# Patient Record
Sex: Female | Born: 1965 | Race: Black or African American | Hispanic: No | Marital: Married | Smoking: Former smoker
Health system: Southern US, Community
[De-identification: ages and names within clinical notes are randomized; demographics above are authoritative.]

## PROBLEM LIST (undated history)

## (undated) DIAGNOSIS — M1990 Unspecified osteoarthritis, unspecified site: Secondary | ICD-10-CM

## (undated) DIAGNOSIS — M549 Dorsalgia, unspecified: Secondary | ICD-10-CM

## (undated) DIAGNOSIS — M797 Fibromyalgia: Secondary | ICD-10-CM

## (undated) DIAGNOSIS — F410 Panic disorder [episodic paroxysmal anxiety] without agoraphobia: Secondary | ICD-10-CM

## (undated) DIAGNOSIS — G894 Chronic pain syndrome: Secondary | ICD-10-CM

## (undated) DIAGNOSIS — R51 Headache: Secondary | ICD-10-CM

## (undated) HISTORY — DX: Headache: R51

## (undated) HISTORY — DX: Panic disorder (episodic paroxysmal anxiety): F41.0

## (undated) HISTORY — PX: OTHER SURGICAL HISTORY: SHX169

## (undated) HISTORY — DX: Chronic pain syndrome: G89.4

## (undated) HISTORY — PX: CARPAL TUNNEL RELEASE: SHX101

## (undated) HISTORY — DX: Unspecified osteoarthritis, unspecified site: M19.90

---

## 2008-12-08 ENCOUNTER — Ambulatory Visit: Payer: Self-pay | Admitting: Diagnostic Radiology

## 2008-12-08 ENCOUNTER — Emergency Department (HOSPITAL_BASED_OUTPATIENT_CLINIC_OR_DEPARTMENT_OTHER): Admission: EM | Admit: 2008-12-08 | Discharge: 2008-12-08 | Payer: Self-pay | Admitting: Emergency Medicine

## 2010-10-05 ENCOUNTER — Emergency Department (HOSPITAL_BASED_OUTPATIENT_CLINIC_OR_DEPARTMENT_OTHER)
Admission: EM | Admit: 2010-10-05 | Discharge: 2010-10-05 | Disposition: A | Discharge status: Home / Self Care | Payer: No Typology Code available for payment source | Attending: Emergency Medicine | Admitting: Emergency Medicine

## 2010-10-05 ENCOUNTER — Emergency Department (INDEPENDENT_AMBULATORY_CARE_PROVIDER_SITE_OTHER): Payer: No Typology Code available for payment source

## 2010-10-05 DIAGNOSIS — M542 Cervicalgia: Secondary | ICD-10-CM

## 2010-10-05 DIAGNOSIS — Y92410 Unspecified street and highway as the place of occurrence of the external cause: Secondary | ICD-10-CM | POA: Insufficient documentation

## 2010-10-05 DIAGNOSIS — S01501A Unspecified open wound of lip, initial encounter: Secondary | ICD-10-CM | POA: Insufficient documentation

## 2010-10-05 DIAGNOSIS — M546 Pain in thoracic spine: Secondary | ICD-10-CM

## 2010-10-05 DIAGNOSIS — M549 Dorsalgia, unspecified: Secondary | ICD-10-CM | POA: Insufficient documentation

## 2010-10-05 DIAGNOSIS — M545 Low back pain: Secondary | ICD-10-CM

## 2011-02-02 ENCOUNTER — Encounter: Payer: Self-pay | Admitting: *Deleted

## 2011-02-02 ENCOUNTER — Emergency Department (INDEPENDENT_AMBULATORY_CARE_PROVIDER_SITE_OTHER): Payer: Medicare Other

## 2011-02-02 ENCOUNTER — Emergency Department (HOSPITAL_BASED_OUTPATIENT_CLINIC_OR_DEPARTMENT_OTHER)
Admission: EM | Admit: 2011-02-02 | Discharge: 2011-02-02 | Disposition: A | Discharge status: Home / Self Care | Payer: Medicare Other | Attending: Emergency Medicine | Admitting: Emergency Medicine

## 2011-02-02 DIAGNOSIS — M6281 Muscle weakness (generalized): Secondary | ICD-10-CM

## 2011-02-02 DIAGNOSIS — F17200 Nicotine dependence, unspecified, uncomplicated: Secondary | ICD-10-CM | POA: Insufficient documentation

## 2011-02-02 DIAGNOSIS — M549 Dorsalgia, unspecified: Secondary | ICD-10-CM

## 2011-02-02 DIAGNOSIS — M545 Low back pain, unspecified: Secondary | ICD-10-CM | POA: Insufficient documentation

## 2011-02-02 HISTORY — DX: Fibromyalgia: M79.7

## 2011-02-02 HISTORY — DX: Dorsalgia, unspecified: M54.9

## 2011-02-02 HISTORY — DX: Unspecified osteoarthritis, unspecified site: M19.90

## 2011-02-02 MED ORDER — CYCLOBENZAPRINE HCL 5 MG PO TABS: 5.0000 mg | ORAL_TABLET | Freq: Three times a day (TID) | ORAL | Status: AC | PRN

## 2011-02-02 MED ORDER — HYDROCODONE-ACETAMINOPHEN 5-325 MG PO TABS: 2.0000 | ORAL_TABLET | Freq: Once | ORAL | Status: DC

## 2011-02-02 MED ORDER — HYDROCODONE-ACETAMINOPHEN 5-325 MG PO TABS
2.0000 | ORAL_TABLET | Freq: Four times a day (QID) | ORAL | Status: AC | PRN
Start: 2011-02-02 — End: 2011-02-12
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
## 2011-02-02 NOTE — ED Provider Notes (Signed)
History     CSN: [id number] Arrival date & time: 02/02/2011 10:13 AM   First MD Initiated Contact with Patient 02/02/11 1023      Chief Complaint  Patient presents with  . Back Pain    (Consider location/radiation/quality/duration/timing/severity/associated sxs/prior treatment) HPI The patient has a history of fibromyalgia as well as chronic back pain. She presents today as she had a fall at home and she reports that she had sudden weakness in her right leg. On further thought she does note that she's had weakness in her leg previously. Patient is a poor historian. She denies urinary or bowel incontinence. She denies any saddle anesthesia. She does endorse low back pain radiates down the posterior aspect of the right leg. This made worse with movement better with rest. There are no other associated or modifying factors. Past Medical History  Diagnosis Date  . Back pain   . Osteoarthritis   . Migraine   . Fibromyalgia     Past Surgical History  Procedure Date  . Arms   . R ring finger surgery     No family history on file.  History  Substance Use Topics  . Smoking status: Current Everyday Smoker  . Smokeless tobacco: Not on file  . Alcohol Use: Yes    OB History    Grav Para Term Preterm Abortions TAB SAB Ect Mult Living                  Review of Systems  HENT: Negative.   Eyes: Negative.   Respiratory: Negative.   Cardiovascular: Negative.   Gastrointestinal: Negative.   Genitourinary: Negative.   Musculoskeletal: Positive for back pain.  Neurological: Positive for weakness.  Hematological: Negative.   Psychiatric/Behavioral: Negative.   All other systems reviewed and are negative.    Allergies  Tylenol  Home Medications   Current Outpatient Rx  Name Route Sig Dispense Refill  . CITALOPRAM HYDROBROMIDE 10 MG PO TABS Oral Take 10 mg by mouth daily.      Marland Kitchen GABADONE PO Oral Take by mouth.      . CYCLOBENZAPRINE HCL 5 MG PO TABS Oral Take 1 tablet (5  mg total) by mouth 3 (three) times daily as needed for muscle spasms. 30 tablet 0  . HYDROCODONE-ACETAMINOPHEN 5-325 MG PO TABS Oral Take 2 tablets by mouth every 6 (six) hours as needed for pain. 20 tablet 0    BP 137/84  Pulse 84  Temp(Src) 98.5 F (36.9 C) (Oral)  Resp 19  SpO2 100%  Physical Exam  Nursing note and vitals reviewed. Constitutional: She is oriented to person, place, and time. She appears well-developed and well-nourished. No distress.  HENT:  Head: Normocephalic and atraumatic.  Eyes: Conjunctivae and EOM are normal. Pupils are equal, round, and reactive to light.  Neck: Normal range of motion.  Cardiovascular: Normal rate, regular rhythm and normal heart sounds.  Exam reveals no gallop and no friction rub.   No murmur heard. Pulmonary/Chest: Effort normal.  Abdominal: Soft. Bowel sounds are normal. She exhibits no distension. There is no tenderness. There is no rebound and no guarding.  Musculoskeletal: Normal range of motion. She exhibits tenderness. She exhibits no edema.       To palpation at L4-L5 area in the midline as well as the right paraspinal muscle area.  Neurological: She is alert and oriented to person, place, and time. No cranial nerve deficit. Coordination normal.       Patient has decreased strength in  the right lower leg is 4/5 for knee extension and hip flexion compared to the out of 5 in the left leg. Patient says it has been weak with this in the past that she's not certain if this is different or not the  Skin: Skin is warm and dry.  Psychiatric: She has a normal mood and affect.    ED Course  Procedures (including critical care time)  Labs Reviewed - No data to display Dg Lumbar Spine Complete  02/02/2011  *RADIOLOGY REPORT*  Clinical Data: Back pain and right lower extremity weakness.  LUMBAR SPINE - COMPLETE 4+ VIEW  Comparison: 10/05/2010  Findings: No fracture.  No subluxation.  Intervertebral disc spaces are preserved with some minimal  loss of height noted at L3-4, stable.  The facets are well-aligned.  SI joints are normal.  IMPRESSION: Stable exam.  No new or acute interval findings.  Original Report Authenticated By: ERIC A. MANSELL, M.D.     1. Back pain       MDM  Patient was admitted and examined. Patient did have weakness in the right lower chin he. It was unclear given patient's poor ability to provide history whether this was new or not. A plain film was taken of the back. They showed no new findings. Patient was able to ambulate and did not have any other concerning symptoms. She was told to followup with her regular doctor. Patient had come in at her regular doctor's plan on starting her on muscle relaxer. We did provide her with a prescription for this. Patient was also given a brief prescription for Vicodin. She was discharged home in good condition.        Cyndra Numbers, MD 02/02/11 [phone]

## 2011-07-15 ENCOUNTER — Emergency Department (HOSPITAL_BASED_OUTPATIENT_CLINIC_OR_DEPARTMENT_OTHER)
Admission: EM | Admit: 2011-07-15 | Discharge: 2011-07-15 | Disposition: A | Discharge status: Home / Self Care | Payer: Medicare Other | Attending: Emergency Medicine | Admitting: Emergency Medicine

## 2011-07-15 ENCOUNTER — Encounter (HOSPITAL_BASED_OUTPATIENT_CLINIC_OR_DEPARTMENT_OTHER): Payer: Self-pay | Admitting: *Deleted

## 2011-07-15 DIAGNOSIS — IMO0001 Reserved for inherently not codable concepts without codable children: Secondary | ICD-10-CM | POA: Insufficient documentation

## 2011-07-15 DIAGNOSIS — R079 Chest pain, unspecified: Secondary | ICD-10-CM | POA: Insufficient documentation

## 2011-07-15 DIAGNOSIS — R109 Unspecified abdominal pain: Secondary | ICD-10-CM | POA: Insufficient documentation

## 2011-07-15 LAB — DIFFERENTIAL
Basophils Absolute: 0 10*3/uL (ref 0.0–0.1)
Basophils Relative: 0 % (ref 0–1)
Eosinophils Relative: 1 % (ref 0–5)
Monocytes Absolute: 0.6 10*3/uL (ref 0.1–1.0)

## 2011-07-15 LAB — TROPONIN I: Troponin I: <0.3 ng/mL (ref <0.30)

## 2011-07-15 LAB — COMPREHENSIVE METABOLIC PANEL
AST: 14 U/L (ref 0–37)
Albumin: 4.1 g/dL (ref 3.5–5.2)
Calcium: 9.2 mg/dL (ref 8.4–10.5)
Creatinine, Ser: 0.5 mg/dL (ref 0.50–1.10)
GFR calc non Af Amer: >90 mL/min (ref >90)

## 2011-07-15 LAB — CBC
HCT: 34.1 % — ABNORMAL LOW (ref 36.0–46.0)
MCHC: 35.8 g/dL (ref 30.0–36.0)
MCV: 87.7 fL (ref 78.0–100.0)
RDW: 11.7 % (ref 11.5–15.5)

## 2011-07-15 MED ORDER — FAMOTIDINE 20 MG PO TABS: 20.0000 mg | ORAL_TABLET | Freq: Two times a day (BID) | ORAL | Status: DC

## 2011-07-15 NOTE — ED Provider Notes (Signed)
Medical screening examination/treatment/procedure(s) were performed by non-physician practitioner and as supervising physician I was immediately available for consultation/collaboration.    Celene Kras, MD 07/15/11 [phone]
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
## 2011-07-15 NOTE — ED Notes (Signed)
About a month she has been having pain in her chest when she yells and screams. Feels sharp at times and then goes away when she calms down. Pain is worse when she lays down.  Woke with numbness in her both arms but more on her right side. Pain in the back of her right leg for 4 months.

## 2011-07-15 NOTE — Discharge Instructions (Signed)
Chest Pain (Nonspecific) It is often hard to give a specific diagnosis for the cause of chest pain. There is always a chance that your pain could be related to something serious, such as a heart attack or a blood clot in the lungs. You need to follow up with your caregiver for further evaluation. CAUSES   Heartburn.   Pneumonia or bronchitis.   Anxiety or stress.   Inflammation around your heart (pericarditis) or lung (pleuritis or pleurisy).   A blood clot in the lung.   A collapsed lung (pneumothorax). It can develop suddenly on its own (spontaneous pneumothorax) or from injury (trauma) to the chest.   Shingles infection (herpes zoster virus).  The chest wall is composed of bones, muscles, and cartilage. Any of these can be the source of the pain.  The bones can be bruised by injury.   The muscles or cartilage can be strained by coughing or overwork.   The cartilage can be affected by inflammation and become sore (costochondritis).  DIAGNOSIS  Lab tests or other studies, such as X-rays, electrocardiography, stress testing, or cardiac imaging, may be needed to find the cause of your pain.  TREATMENT   Treatment depends on what may be causing your chest pain. Treatment may include:   Acid blockers for heartburn.   Anti-inflammatory medicine.   Pain medicine for inflammatory conditions.   Antibiotics if an infection is present.   You may be advised to change lifestyle habits. This includes stopping smoking and avoiding alcohol, caffeine, and chocolate.   You may be advised to keep your head raised (elevated) when sleeping. This reduces the chance of acid going backward from your stomach into your esophagus.   Most of the time, nonspecific chest pain will improve within 2 to 3 days with rest and mild pain medicine.  HOME CARE INSTRUCTIONS   If antibiotics were prescribed, take your antibiotics as directed. Finish them even if you start to feel better.   For the next few  days, avoid physical activities that bring on chest pain. Continue physical activities as directed.   Do not smoke.   Avoid drinking alcohol.   Only take over-the-counter or prescription medicine for pain, discomfort, or fever as directed by your caregiver.   Follow your caregiver's suggestions for further testing if your chest pain does not go away.   Keep any follow-up appointments you made. If you do not go to an appointment, you could develop lasting (chronic) problems with pain. If there is any problem keeping an appointment, you must call to reschedule.  SEEK MEDICAL CARE IF:   You think you are having problems from the medicine you are taking. Read your medicine instructions carefully.   Your chest pain does not go away, even after treatment.   You develop a rash with blisters on your chest.  SEEK IMMEDIATE MEDICAL CARE IF:   You have increased chest pain or pain that spreads to your arm, neck, jaw, back, or abdomen.   You develop shortness of breath, an increasing cough, or you are coughing up blood.   You have severe back or abdominal pain, feel nauseous, or vomit.   You develop severe weakness, fainting, or chills.   You have a fever.  THIS IS AN EMERGENCY. Do not wait to see if the pain will go away. Get medical help at once. Call your local emergency services (911 in [place] Do not drive yourself to the hospital. MAKE SURE YOU:   Understand these instructions.     Will watch your condition.   Will get help right away if you are not doing well or get worse.  Document Released: 12/26/2004 Document Revised: 03/07/2011 Document Reviewed: 10/22/2007 [employer] Patient Information 2012 [employer]. 

## 2011-10-20 ENCOUNTER — Emergency Department (HOSPITAL_BASED_OUTPATIENT_CLINIC_OR_DEPARTMENT_OTHER): Payer: Medicare Other

## 2011-10-20 ENCOUNTER — Emergency Department (HOSPITAL_BASED_OUTPATIENT_CLINIC_OR_DEPARTMENT_OTHER)
Admission: EM | Admit: 2011-10-20 | Discharge: 2011-10-20 | Disposition: A | Discharge status: Home / Self Care | Payer: Medicare Other | Attending: Emergency Medicine | Admitting: Emergency Medicine

## 2011-10-20 ENCOUNTER — Encounter (HOSPITAL_BASED_OUTPATIENT_CLINIC_OR_DEPARTMENT_OTHER): Payer: Self-pay | Admitting: *Deleted

## 2011-10-20 ENCOUNTER — Other Ambulatory Visit: Payer: Self-pay

## 2011-10-20 DIAGNOSIS — R071 Chest pain on breathing: Secondary | ICD-10-CM | POA: Insufficient documentation

## 2011-10-20 DIAGNOSIS — IMO0001 Reserved for inherently not codable concepts without codable children: Secondary | ICD-10-CM | POA: Insufficient documentation

## 2011-10-20 DIAGNOSIS — M791 Myalgia, unspecified site: Secondary | ICD-10-CM

## 2011-10-20 DIAGNOSIS — Z87891 Personal history of nicotine dependence: Secondary | ICD-10-CM | POA: Insufficient documentation

## 2011-10-20 DIAGNOSIS — R0789 Other chest pain: Secondary | ICD-10-CM

## 2011-10-20 DIAGNOSIS — M1990 Unspecified osteoarthritis, unspecified site: Secondary | ICD-10-CM | POA: Insufficient documentation

## 2011-10-20 MED ORDER — HYDROCODONE-ACETAMINOPHEN 7.5-500 MG PO TABS: 1.0000 | ORAL_TABLET | Freq: Four times a day (QID) | ORAL | Status: AC | PRN

## 2011-10-20 NOTE — ED Provider Notes (Signed)
History     CSN: [id number]  Arrival date & time 10/20/11  1008   First MD Initiated Contact with Patient 10/20/11 1028      Chief Complaint  Patient presents with  . Chest Pain    (Consider location/radiation/quality/duration/timing/severity/associated sxs/prior treatment) HPI Comments: Also pt complaining of left arm pain, swelling, and knots.  States lifts all the time and recently started fentanyl patch and using hydrocodone but ran out of pills and still having pain.  Denies injury.  Patient is a 46 y.o. female presenting with chest pain. The history is provided by the patient.  Chest Pain The chest pain began 1 - 2 weeks ago. Chest pain occurs constantly. The chest pain is unchanged. Associated with: palpation. At its most intense, the pain is at 6/10. The pain is currently at 6/10. The severity of the pain is moderate. The quality of the pain is described as pleuritic, sharp and stabbing. The pain does not radiate. Chest pain is worsened by certain positions. Primary symptoms include abdominal pain. Pertinent negatives for primary symptoms include no fever, no cough and no palpitations. Primary symptoms comment: pt has regional pain syndrome and states she has been having worsening pain in the abd lately.  Pertinent negatives for associated symptoms include no diaphoresis and no lower extremity edema. She tried nothing for the symptoms. Risk factors include no known risk factors.     Past Medical History  Diagnosis Date  . Back pain   . Osteoarthritis   . Migraine   . Fibromyalgia     Past Surgical History  Procedure Date  . Arms   . R ring finger surgery     No family history on file.  History  Substance Use Topics  . Smoking status: Former Games developer  . Smokeless tobacco: Not on file  . Alcohol Use: Yes    OB History    Grav Para Term Preterm Abortions TAB SAB Ect Mult Living                  Review of Systems  Constitutional: Negative for fever and  diaphoresis.  Respiratory: Negative for cough.   Cardiovascular: Positive for chest pain. Negative for palpitations.  Gastrointestinal: Positive for abdominal pain.  All other systems reviewed and are negative.    Allergies  Tylenol  Home Medications   Current Outpatient Rx  Name Route Sig Dispense Refill  . FENTANYL 25 MCG/HR TD PT72 Transdermal Place 1 patch onto the skin every 3 (three) days.    Marland Kitchen HYDROCODONE-ACETAMINOPHEN 7.5-325 MG PO TABS Oral Take 1 tablet by mouth every 6 (six) hours as needed.    . MELOXICAM 15 MG PO TABS Oral Take 15 mg by mouth daily.    Marland Kitchen CITALOPRAM HYDROBROMIDE 10 MG PO TABS Oral Take 10 mg by mouth daily.      Marland Kitchen GABADONE PO Oral Take by mouth.      . FAMOTIDINE 20 MG PO TABS Oral Take 1 tablet (20 mg total) by mouth 2 (two) times daily. 30 tablet 0  . HYDROCODONE-ACETAMINOPHEN 7.5-500 MG PO TABS Oral Take 1 tablet by mouth every 6 (six) hours as needed for pain. 20 tablet 0  . HYDROCODONE-ACETAMINOPHEN 7.5-500 MG PO TABS Oral Take 1 tablet by mouth every 6 (six) hours as needed.      BP 150/87  Pulse 79  Temp 98.7 F (37.1 C) (Oral)  Resp 18  SpO2 100%  Physical Exam  Nursing note and vitals reviewed. Constitutional: She  is oriented to person, place, and time. She appears well-developed and well-nourished. No distress.  HENT:  Head: Normocephalic and atraumatic.  Mouth/Throat: Oropharynx is clear and moist.  Eyes: Conjunctivae and EOM are normal. Pupils are equal, round, and reactive to light.  Neck: Normal range of motion. Neck supple.  Cardiovascular: Normal rate, regular rhythm and intact distal pulses.   No murmur heard. Pulmonary/Chest: Effort normal and breath sounds normal. No respiratory distress. She has no wheezes. She has no rales. She exhibits tenderness and bony tenderness.    Abdominal: Soft. She exhibits no distension. There is no tenderness. There is no rebound and no guarding.  Musculoskeletal: Normal range of motion. She  exhibits tenderness. She exhibits no edema.       Left forearm: She exhibits tenderness. She exhibits no swelling and no deformity.       Arms: Neurological: She is alert and oriented to person, place, and time.  Skin: Skin is warm and dry. No rash noted. No erythema.  Psychiatric: She has a normal mood and affect. Her behavior is normal.    ED Course  Procedures (including critical care time)  Labs Reviewed - No data to display Dg Chest 2 View  10/20/2011  *RADIOLOGY REPORT*  Clinical Data: Left forearm pain, chest pain, chest wall pain  CHEST - 2 VIEW  Comparison:  10/05/2010  Findings:  The heart size and mediastinal contours are within normal limits.  Both lungs are clear.  The visualized skeletal structures are unremarkable.  IMPRESSION: No active cardiopulmonary disease.  Original Report Authenticated By: Judie Petit. Ruel Favors, M.D.   Dg Forearm Left  10/20/2011  *RADIOLOGY REPORT*  Clinical Data: Arm pain  LEFT FOREARM - 2 VIEW  Comparison: None.  Findings: Intact left radius and ulna.  Remote avulsion injury or unfused ossicle of the ulnar styloid at the wrist.  Normal alignment.  No radiographic swelling or foreign body.  IMPRESSION: No acute osseous finding  Original Report Authenticated By: Judie Petit. Ruel Favors, M.D.    Date: 10/20/2011  Rate: 75  Rhythm: normal sinus rhythm  QRS Axis: normal  Intervals: normal  ST/T Wave abnormalities: Nonspecific T wave abnormality  Conduction Disutrbances:none  Narrative Interpretation:   Old EKG Reviewed: unchanged    1. Muscle pain   2. Chest wall pain       MDM   Patient with a history of fibromyalgia and regional pain syndrome who has been seeing a neurologist for treatment and recently started panel patches last week states she's had worsening left-sided arm pain and mild swelling. Also as well as left-sided intermittent chest and abdominal pain. The pain is worse with palpation and moving the left arm but is not cough shortness of  breath or have any cardiac-type symptoms. Her EKG was within normal limits and this does not appear to be cardiac or pulmonary in origin. PERC negative. Chest x-ray within normal limits. Film of the forearm was also negative. Patient was given a refill of her Lortab and she is to followup with her pain specialist.        Gwyneth Sprout, MD 10/20/11 1135
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
## 2012-08-10 ENCOUNTER — Emergency Department (HOSPITAL_BASED_OUTPATIENT_CLINIC_OR_DEPARTMENT_OTHER)
Admission: EM | Admit: 2012-08-10 | Discharge: 2012-08-10 | Disposition: A | Discharge status: Home / Self Care | Payer: Medicare Other | Attending: Emergency Medicine | Admitting: Emergency Medicine

## 2012-08-10 ENCOUNTER — Encounter (HOSPITAL_BASED_OUTPATIENT_CLINIC_OR_DEPARTMENT_OTHER): Payer: Self-pay | Admitting: Emergency Medicine

## 2012-08-10 DIAGNOSIS — Z87891 Personal history of nicotine dependence: Secondary | ICD-10-CM | POA: Insufficient documentation

## 2012-08-10 DIAGNOSIS — K0889 Other specified disorders of teeth and supporting structures: Secondary | ICD-10-CM

## 2012-08-10 DIAGNOSIS — K089 Disorder of teeth and supporting structures, unspecified: Secondary | ICD-10-CM | POA: Insufficient documentation

## 2012-08-10 DIAGNOSIS — IMO0001 Reserved for inherently not codable concepts without codable children: Secondary | ICD-10-CM | POA: Insufficient documentation

## 2012-08-10 DIAGNOSIS — Z79899 Other long term (current) drug therapy: Secondary | ICD-10-CM | POA: Insufficient documentation

## 2012-08-10 DIAGNOSIS — M1990 Unspecified osteoarthritis, unspecified site: Secondary | ICD-10-CM | POA: Insufficient documentation

## 2012-08-10 DIAGNOSIS — Z8679 Personal history of other diseases of the circulatory system: Secondary | ICD-10-CM | POA: Insufficient documentation

## 2012-08-10 MED ORDER — BUPIVACAINE-EPINEPHRINE PF 0.5-1:200000 % IJ SOLN
1.8000 mL | Freq: Once | INTRAMUSCULAR | Status: AC
  Administered 2012-08-10: 9 mg
  Filled 2012-08-10: qty 1.8

## 2012-08-10 MED ORDER — BUPIVACAINE-EPINEPHRINE (PF) 0.5% -1:200000 IJ SOLN
INTRAMUSCULAR | Status: AC
  Administered 2012-08-10: 9 mg
  Filled 2012-08-10: qty 1.8
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
## 2012-08-10 NOTE — ED Notes (Signed)
Pt bit her tongue ring and her front bottom tooth is now painful but intact. Pt's hydrocodone at home has not helped. Pt reports "I just haven't been able to find a dentist." No other complaints.

## 2012-10-31 IMAGING — CR DG CHEST 2V
2 series · 2 of 2 positions shown · non-contrast
Comparison: 10/05/2010

CLINICAL DATA: Left forearm pain, chest pain, chest wall pain

CHEST - 2 VIEW

[w chest pa]
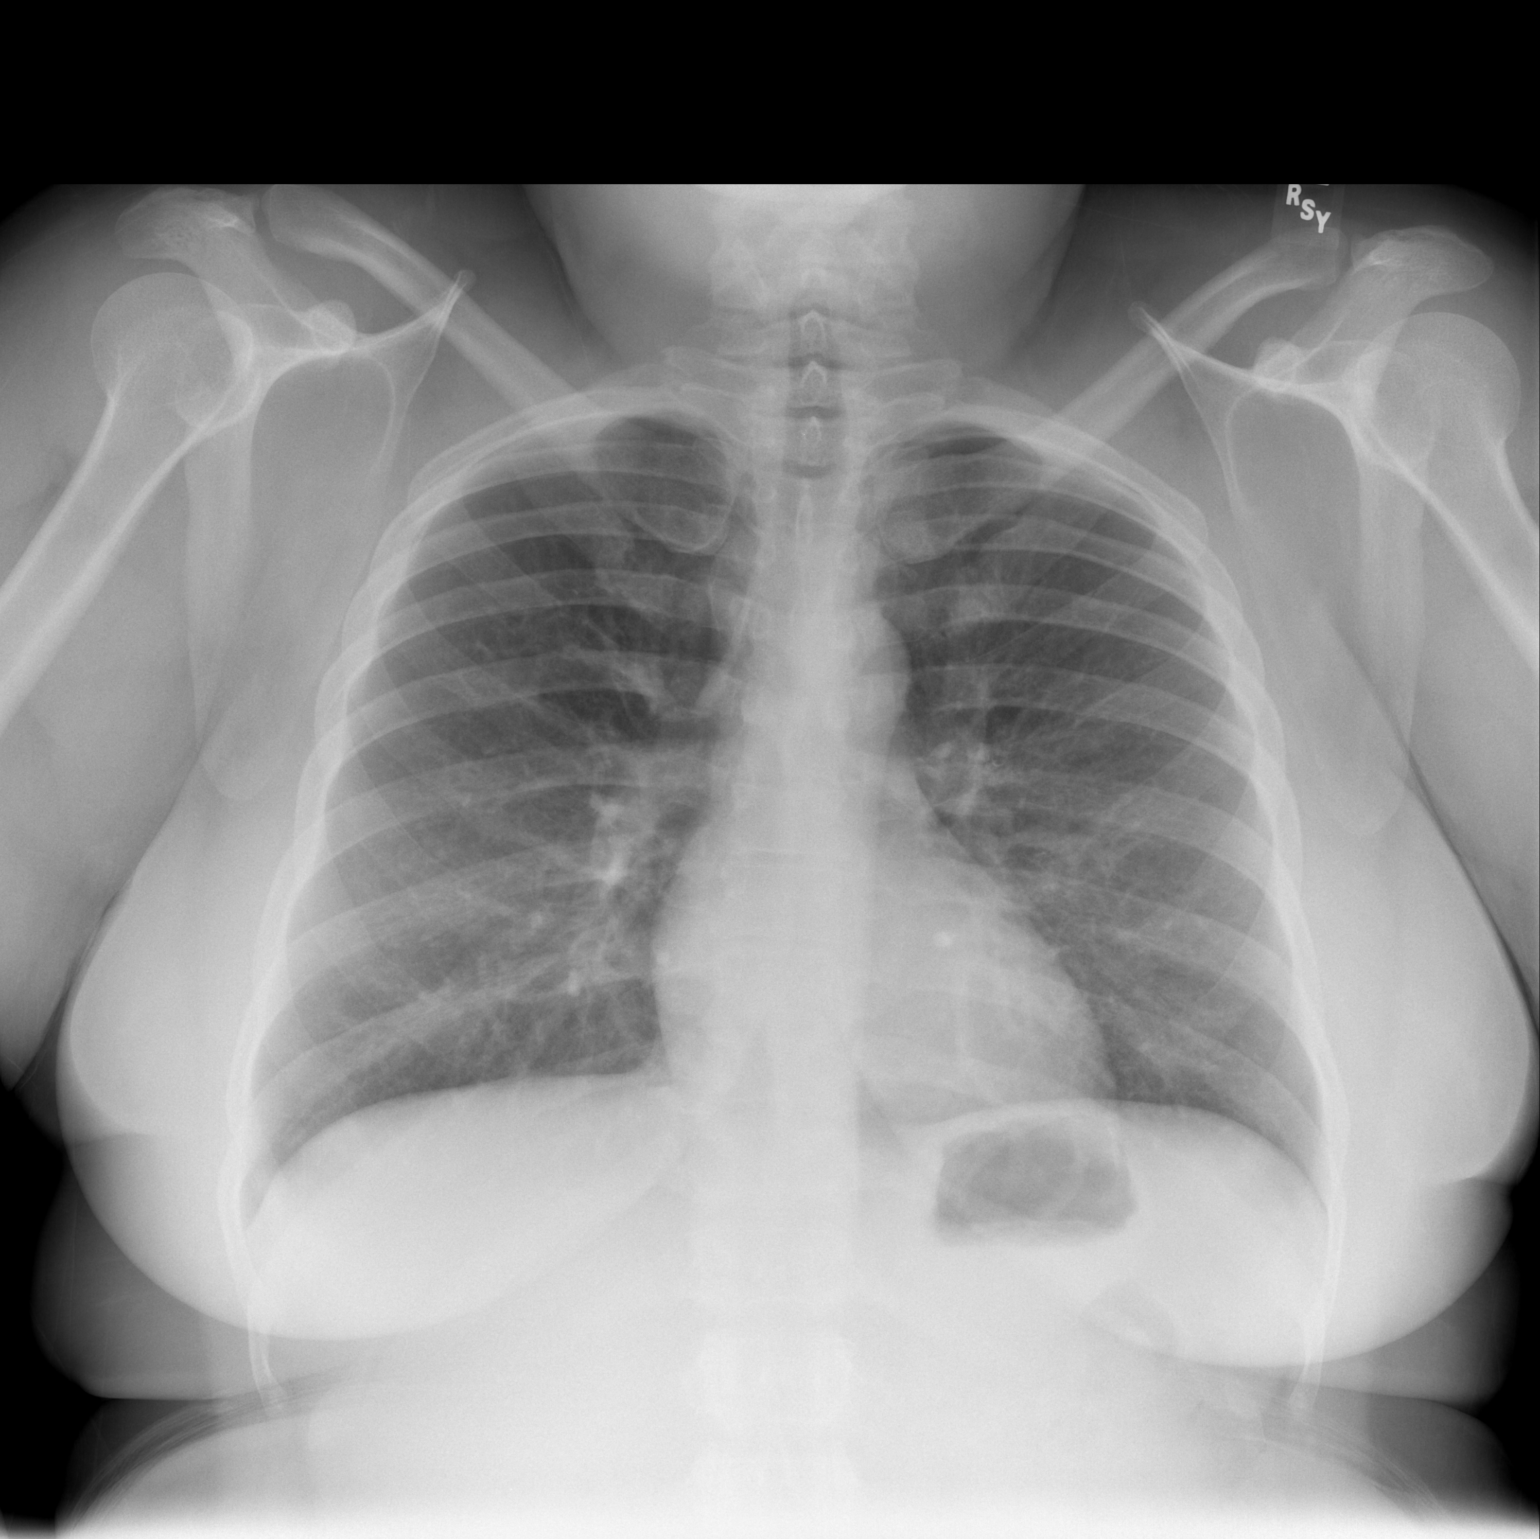

[w chest lat]
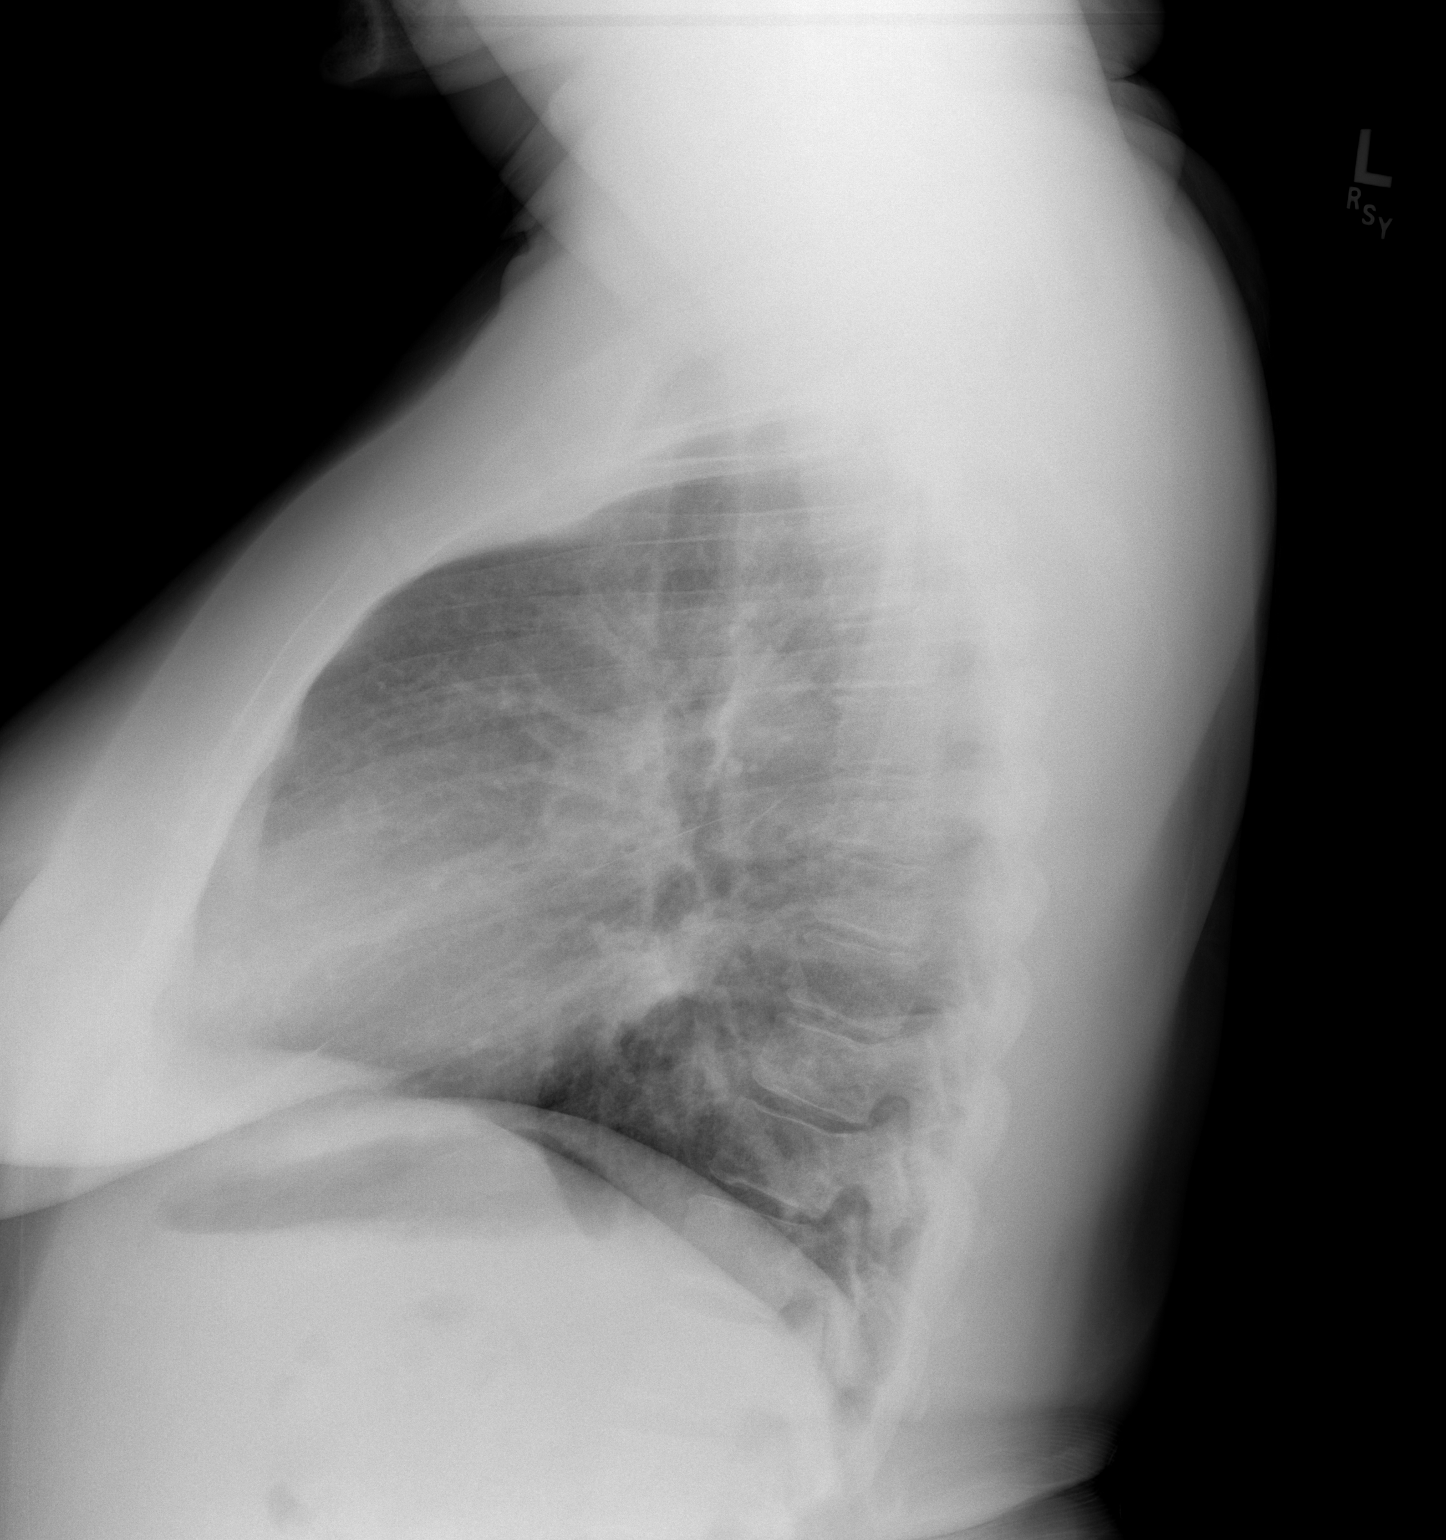

[2 of 2 positions shown; findings below may reference images not displayed]

FINDINGS: The heart size and mediastinal contours are within
normal limits.  Both lungs are clear.  The visualized skeletal
structures are unremarkable.
IMPRESSION: No active cardiopulmonary disease.

## 2012-10-31 IMAGING — CR DG FOREARM 2V*L*
2 series · 2 of 2 positions shown · non-contrast
Comparison: None.

CLINICAL DATA: Arm pain

LEFT FOREARM - 2 VIEW

[x forearm ap left]
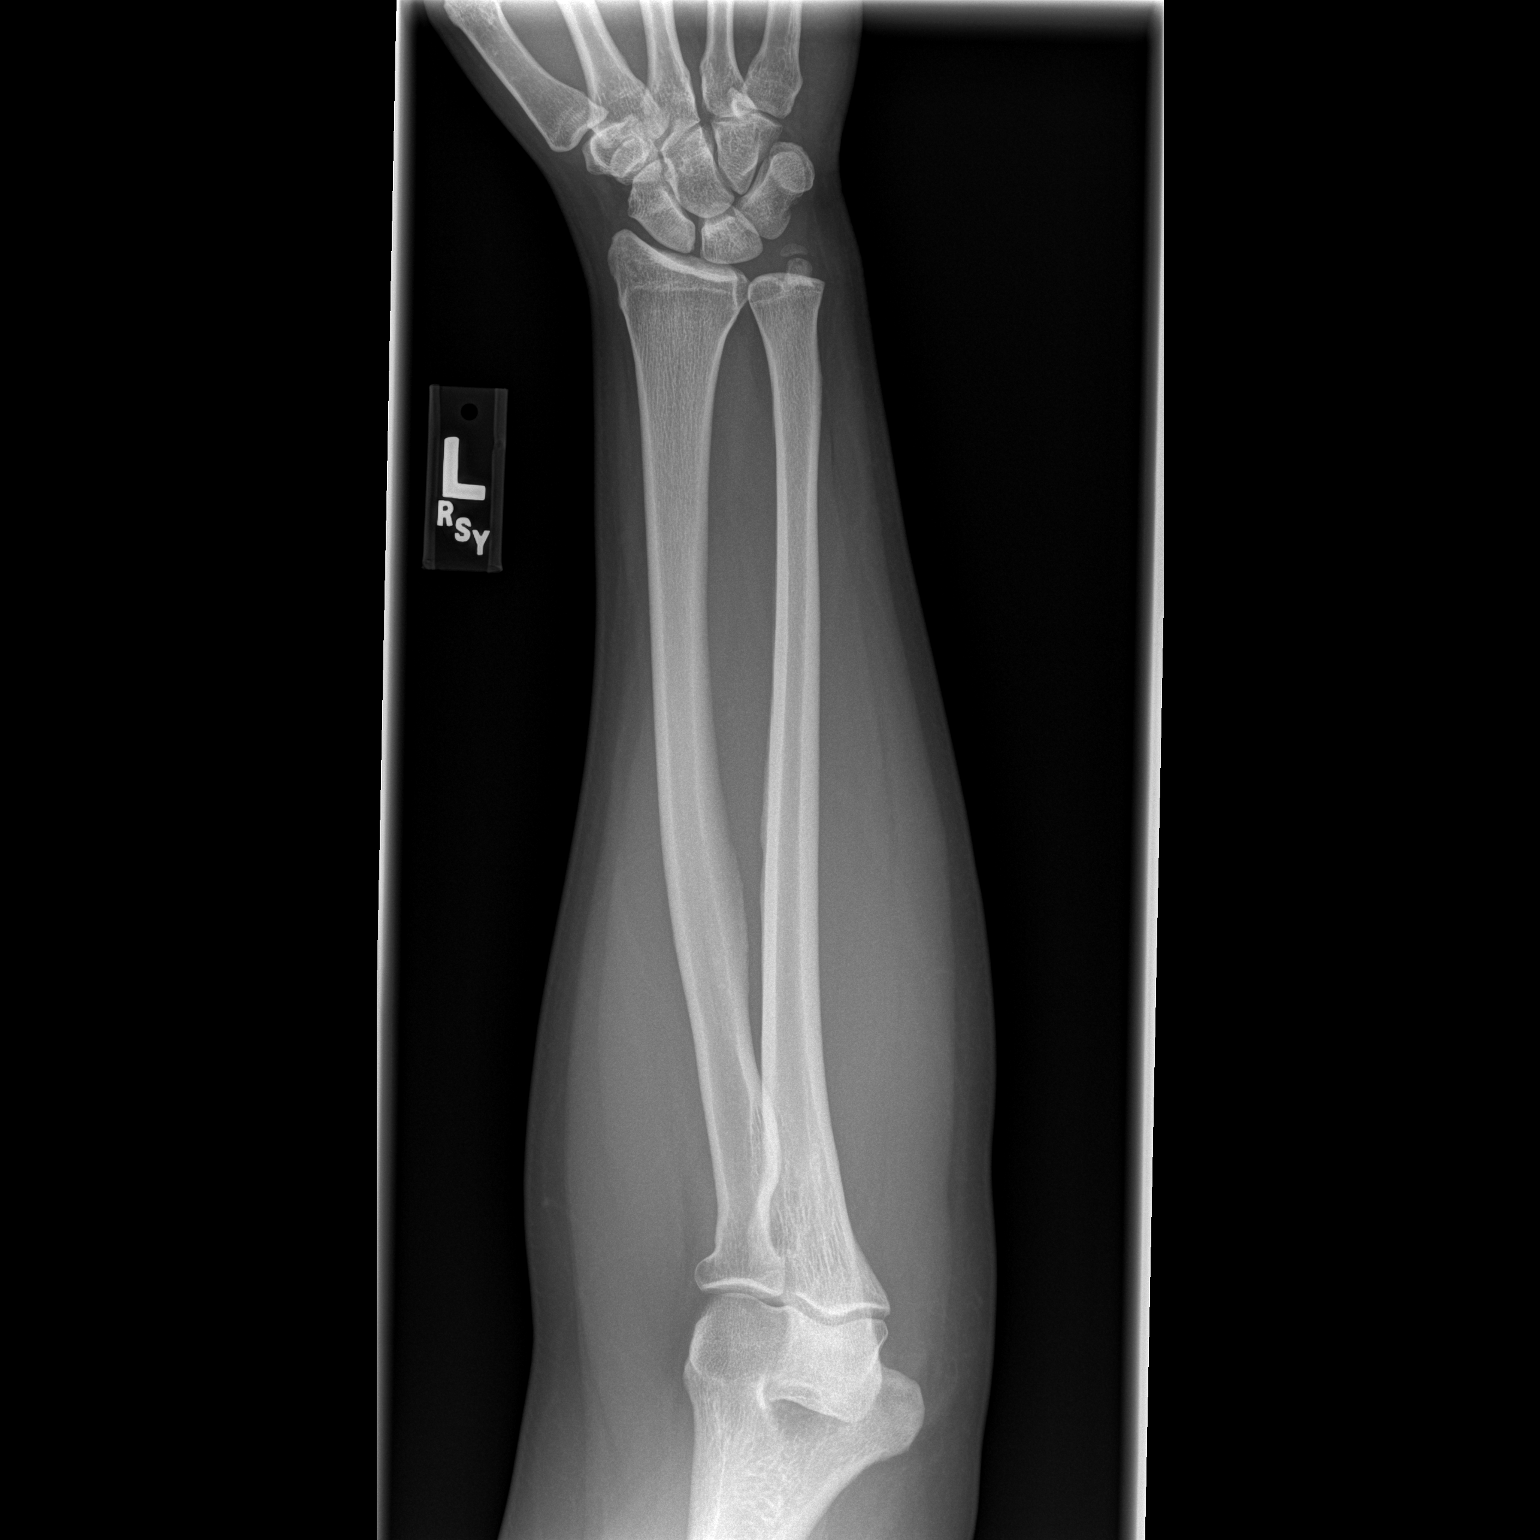

[x forearm lat left]
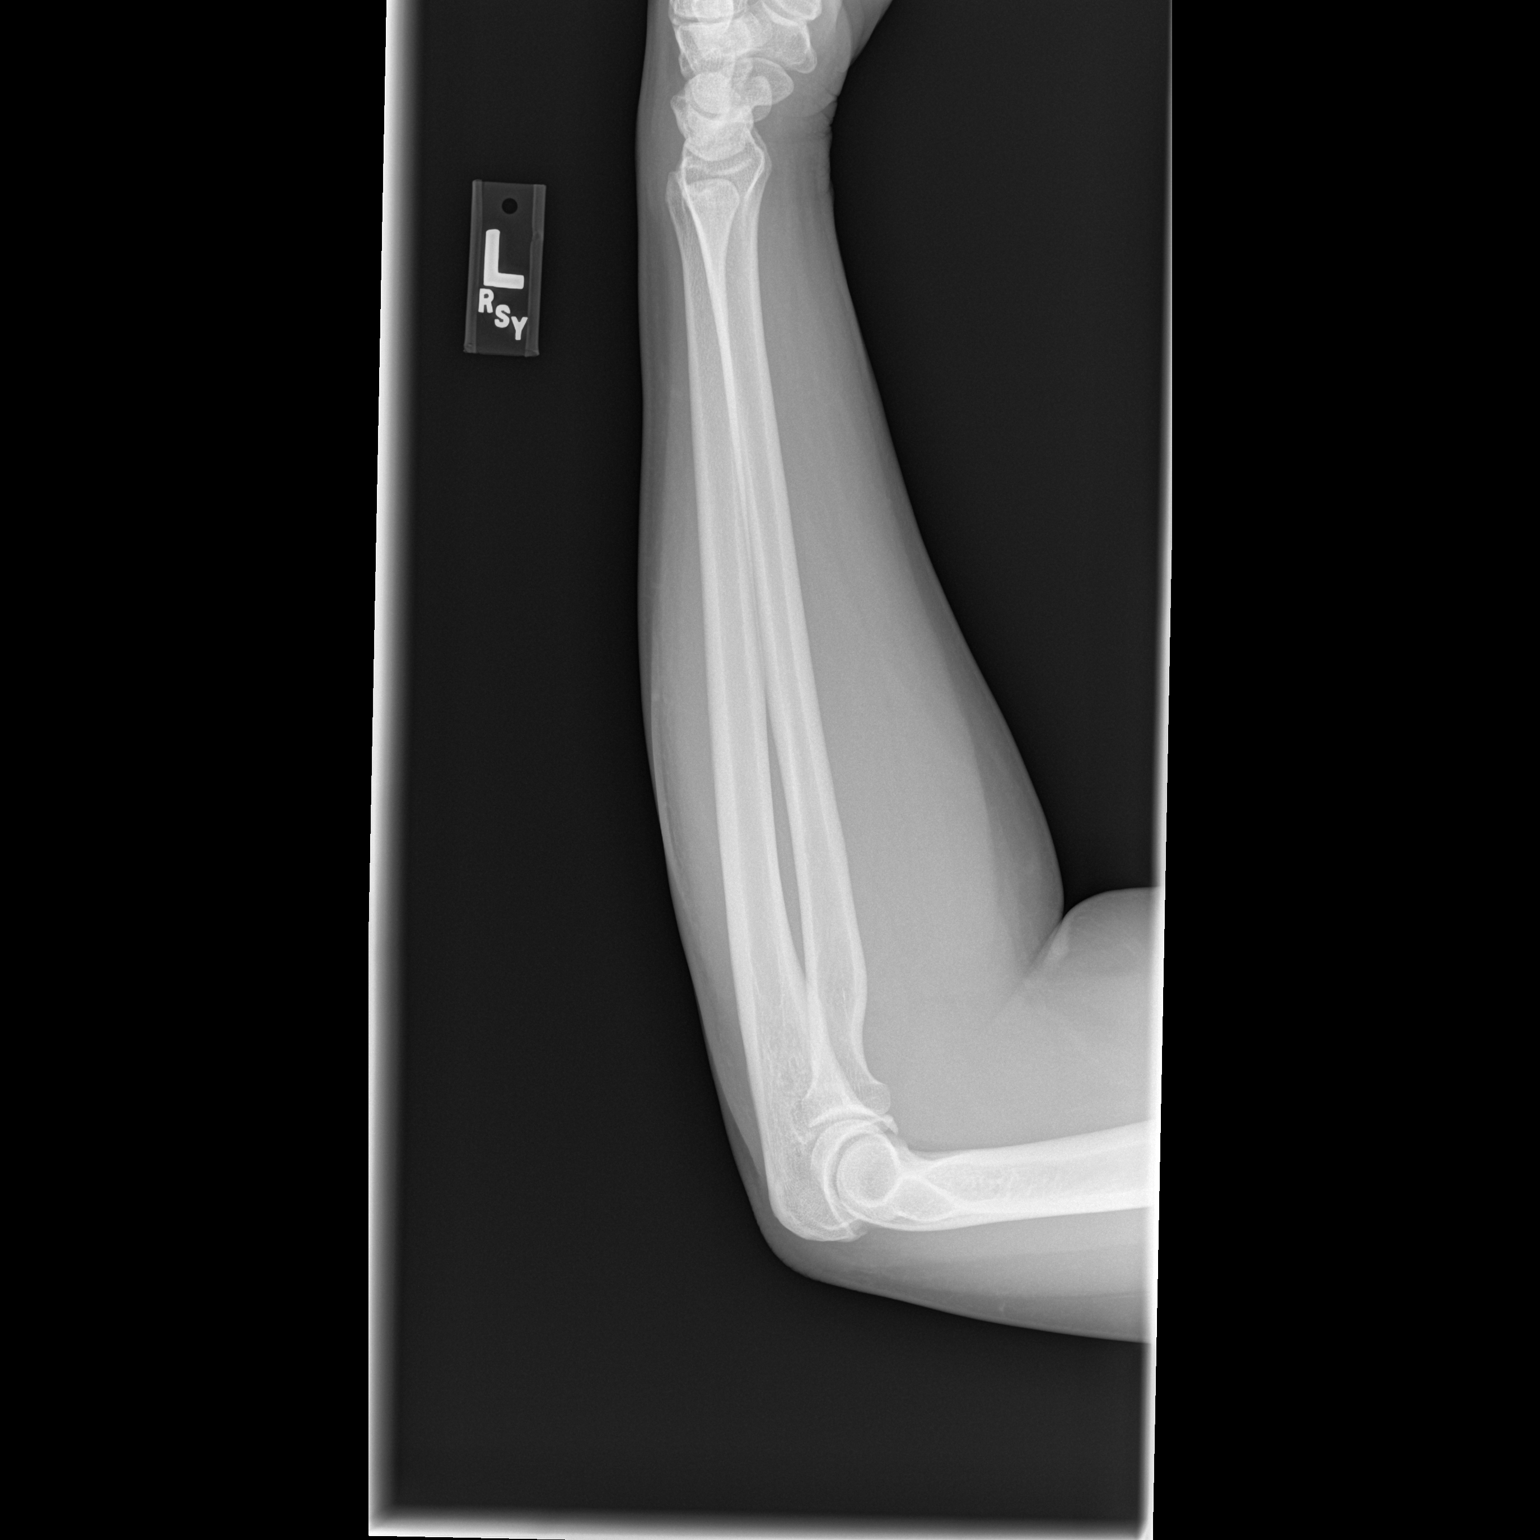

[2 of 2 positions shown; findings below may reference images not displayed]

FINDINGS: Intact left radius and ulna.  Remote avulsion injury or
unfused ossicle of the ulnar styloid at the wrist.  Normal
alignment.  No radiographic swelling or foreign body.
IMPRESSION: No acute osseous finding

## 2013-08-19 ENCOUNTER — Encounter (HOSPITAL_BASED_OUTPATIENT_CLINIC_OR_DEPARTMENT_OTHER): Payer: Self-pay | Admitting: Emergency Medicine

## 2013-08-19 ENCOUNTER — Emergency Department (HOSPITAL_BASED_OUTPATIENT_CLINIC_OR_DEPARTMENT_OTHER)
Admission: EM | Admit: 2013-08-19 | Discharge: 2013-08-19 | Disposition: A | Discharge status: Home / Self Care | Payer: Medicare Other | Attending: Emergency Medicine | Admitting: Emergency Medicine

## 2013-08-19 ENCOUNTER — Emergency Department (HOSPITAL_BASED_OUTPATIENT_CLINIC_OR_DEPARTMENT_OTHER): Payer: Medicare Other

## 2013-08-19 DIAGNOSIS — S139XXA Sprain of joints and ligaments of unspecified parts of neck, initial encounter: Secondary | ICD-10-CM | POA: Insufficient documentation

## 2013-08-19 DIAGNOSIS — Z87891 Personal history of nicotine dependence: Secondary | ICD-10-CM | POA: Insufficient documentation

## 2013-08-19 DIAGNOSIS — Z79899 Other long term (current) drug therapy: Secondary | ICD-10-CM | POA: Insufficient documentation

## 2013-08-19 DIAGNOSIS — R51 Headache: Secondary | ICD-10-CM | POA: Insufficient documentation

## 2013-08-19 DIAGNOSIS — Z791 Long term (current) use of non-steroidal anti-inflammatories (NSAID): Secondary | ICD-10-CM | POA: Insufficient documentation

## 2013-08-19 DIAGNOSIS — Y9389 Activity, other specified: Secondary | ICD-10-CM | POA: Insufficient documentation

## 2013-08-19 DIAGNOSIS — M1990 Unspecified osteoarthritis, unspecified site: Secondary | ICD-10-CM | POA: Insufficient documentation

## 2013-08-19 DIAGNOSIS — S239XXA Sprain of unspecified parts of thorax, initial encounter: Secondary | ICD-10-CM | POA: Insufficient documentation

## 2013-08-19 DIAGNOSIS — IMO0001 Reserved for inherently not codable concepts without codable children: Secondary | ICD-10-CM | POA: Insufficient documentation

## 2013-08-19 DIAGNOSIS — S39012A Strain of muscle, fascia and tendon of lower back, initial encounter: Secondary | ICD-10-CM

## 2013-08-19 DIAGNOSIS — S161XXA Strain of muscle, fascia and tendon at neck level, initial encounter: Secondary | ICD-10-CM

## 2013-08-19 DIAGNOSIS — Y92410 Unspecified street and highway as the place of occurrence of the external cause: Secondary | ICD-10-CM | POA: Insufficient documentation

## 2013-08-19 DIAGNOSIS — S298XXA Other specified injuries of thorax, initial encounter: Secondary | ICD-10-CM | POA: Insufficient documentation

## 2013-08-19 DIAGNOSIS — Z8679 Personal history of other diseases of the circulatory system: Secondary | ICD-10-CM | POA: Insufficient documentation

## 2013-08-19 MED ORDER — CYCLOBENZAPRINE HCL 10 MG PO TABS: 10.0000 mg | ORAL_TABLET | Freq: Two times a day (BID) | ORAL | Status: DC | PRN

## 2013-08-19 MED ORDER — HYDROCODONE-ACETAMINOPHEN 5-325 MG PO TABS: 2.0000 | ORAL_TABLET | Freq: Four times a day (QID) | ORAL | Status: DC | PRN
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
## 2013-08-31 ENCOUNTER — Encounter: Payer: Self-pay | Admitting: Family Medicine

## 2013-08-31 ENCOUNTER — Ambulatory Visit (INDEPENDENT_AMBULATORY_CARE_PROVIDER_SITE_OTHER): Payer: Self-pay | Admitting: Family Medicine

## 2013-08-31 VITALS — BP 139/84 | HR 80 | Ht 64.0 in | Wt 210.0 lb

## 2013-08-31 DIAGNOSIS — M545 Low back pain, unspecified: Secondary | ICD-10-CM

## 2013-08-31 DIAGNOSIS — M549 Dorsalgia, unspecified: Secondary | ICD-10-CM

## 2013-08-31 DIAGNOSIS — M542 Cervicalgia: Secondary | ICD-10-CM

## 2013-08-31 NOTE — Patient Instructions (Signed)
We will go ahead with an MRI of your lumbar spine given the pain, trouble you're having into your legs with weakness. Continue your other medicines as you usually do in the meantime. Results will dictate which direction we go with your treatment.

## 2013-09-04 ENCOUNTER — Ambulatory Visit (HOSPITAL_BASED_OUTPATIENT_CLINIC_OR_DEPARTMENT_OTHER)
Admission: RE | Admit: 2013-09-04 | Discharge: 2013-09-04 | Disposition: A | Discharge status: Home / Self Care | Payer: Medicare Other | Source: Ambulatory Visit | Attending: Family Medicine | Admitting: Family Medicine

## 2013-09-04 DIAGNOSIS — M79609 Pain in unspecified limb: Secondary | ICD-10-CM | POA: Insufficient documentation

## 2013-09-04 DIAGNOSIS — M545 Low back pain, unspecified: Secondary | ICD-10-CM | POA: Insufficient documentation

## 2013-09-04 DIAGNOSIS — M5126 Other intervertebral disc displacement, lumbar region: Secondary | ICD-10-CM | POA: Diagnosis not present

## 2013-09-04 DIAGNOSIS — M549 Dorsalgia, unspecified: Secondary | ICD-10-CM

## 2013-09-07 ENCOUNTER — Other Ambulatory Visit: Payer: Self-pay | Admitting: *Deleted

## 2013-09-07 DIAGNOSIS — S161XXA Strain of muscle, fascia and tendon at neck level, initial encounter: Secondary | ICD-10-CM

## 2013-09-07 DIAGNOSIS — S39012A Strain of muscle, fascia and tendon of lower back, initial encounter: Secondary | ICD-10-CM

## 2013-09-13 ENCOUNTER — Ambulatory Visit: Payer: Medicare Other | Attending: Family Medicine | Admitting: Physical Therapy

## 2013-09-13 ENCOUNTER — Encounter: Payer: Self-pay | Admitting: Family Medicine

## 2013-09-13 DIAGNOSIS — M545 Low back pain, unspecified: Secondary | ICD-10-CM | POA: Diagnosis not present

## 2013-09-13 DIAGNOSIS — M542 Cervicalgia: Secondary | ICD-10-CM | POA: Diagnosis not present

## 2013-09-13 DIAGNOSIS — IMO0001 Reserved for inherently not codable concepts without codable children: Secondary | ICD-10-CM | POA: Diagnosis not present

## 2013-09-13 DIAGNOSIS — R5381 Other malaise: Secondary | ICD-10-CM | POA: Diagnosis not present

## 2013-09-13 DIAGNOSIS — Z9889 Other specified postprocedural states: Secondary | ICD-10-CM | POA: Insufficient documentation
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
## 2013-09-16 ENCOUNTER — Ambulatory Visit: Payer: Medicare Other | Admitting: Rehabilitation

## 2013-09-16 DIAGNOSIS — IMO0001 Reserved for inherently not codable concepts without codable children: Secondary | ICD-10-CM | POA: Diagnosis not present

## 2013-09-20 ENCOUNTER — Ambulatory Visit: Payer: Medicare Other | Admitting: Rehabilitation

## 2013-09-23 ENCOUNTER — Ambulatory Visit: Payer: Medicare Other | Admitting: Rehabilitation

## 2013-09-27 ENCOUNTER — Ambulatory Visit: Payer: Medicare Other | Admitting: Rehabilitation

## 2013-09-27 DIAGNOSIS — IMO0001 Reserved for inherently not codable concepts without codable children: Secondary | ICD-10-CM | POA: Diagnosis not present

## 2013-09-29 ENCOUNTER — Ambulatory Visit: Payer: Medicare Other | Attending: Family Medicine | Admitting: Rehabilitation

## 2013-09-29 DIAGNOSIS — M542 Cervicalgia: Secondary | ICD-10-CM | POA: Insufficient documentation

## 2013-09-29 DIAGNOSIS — Z9889 Other specified postprocedural states: Secondary | ICD-10-CM | POA: Diagnosis not present

## 2013-09-29 DIAGNOSIS — IMO0001 Reserved for inherently not codable concepts without codable children: Secondary | ICD-10-CM | POA: Insufficient documentation

## 2013-09-29 DIAGNOSIS — M545 Low back pain, unspecified: Secondary | ICD-10-CM | POA: Insufficient documentation

## 2013-09-29 DIAGNOSIS — R5381 Other malaise: Secondary | ICD-10-CM | POA: Insufficient documentation

## 2013-09-30 ENCOUNTER — Ambulatory Visit: Payer: Medicare Other | Admitting: Rehabilitation

## 2013-10-04 ENCOUNTER — Ambulatory Visit: Payer: Medicare Other | Admitting: Rehabilitation

## 2013-10-05 ENCOUNTER — Ambulatory Visit: Payer: Medicare Other | Admitting: Rehabilitation

## 2013-10-05 DIAGNOSIS — IMO0001 Reserved for inherently not codable concepts without codable children: Secondary | ICD-10-CM | POA: Diagnosis not present

## 2013-10-07 ENCOUNTER — Ambulatory Visit: Payer: Medicare Other | Admitting: Rehabilitation

## 2013-10-07 DIAGNOSIS — IMO0001 Reserved for inherently not codable concepts without codable children: Secondary | ICD-10-CM | POA: Diagnosis not present

## 2013-10-12 ENCOUNTER — Ambulatory Visit (INDEPENDENT_AMBULATORY_CARE_PROVIDER_SITE_OTHER): Payer: Medicare Other | Admitting: Family Medicine

## 2013-10-12 ENCOUNTER — Encounter: Payer: Self-pay | Admitting: Family Medicine

## 2013-10-12 VITALS — BP 115/80 | HR 76 | Ht 66.0 in | Wt 200.0 lb

## 2013-10-12 DIAGNOSIS — M545 Low back pain, unspecified: Secondary | ICD-10-CM | POA: Diagnosis not present

## 2013-10-12 DIAGNOSIS — M542 Cervicalgia: Secondary | ICD-10-CM | POA: Diagnosis not present

## 2013-10-12 NOTE — Patient Instructions (Signed)
Unfortunately I don't think there's anything further I can offer to help other than what you're doing. Try the fibromyalgia massage. Continue seeing neurology and follow their recommendations. Follow up with me as needed.

## 2013-10-13 ENCOUNTER — Ambulatory Visit: Payer: Medicare Other | Admitting: Rehabilitation

## 2013-10-14 ENCOUNTER — Encounter: Payer: Self-pay | Admitting: Family Medicine
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
## 2013-10-15 ENCOUNTER — Ambulatory Visit: Payer: Medicare Other | Admitting: Rehabilitation

## 2013-10-18 ENCOUNTER — Ambulatory Visit: Payer: Medicare Other | Admitting: Rehabilitation

## 2013-10-20 ENCOUNTER — Ambulatory Visit: Payer: Medicare Other | Admitting: Rehabilitation

## 2013-10-25 ENCOUNTER — Ambulatory Visit: Payer: Medicare Other | Admitting: Rehabilitation

## 2013-10-27 ENCOUNTER — Ambulatory Visit: Payer: Medicare Other | Admitting: Rehabilitation

## 2013-11-01 ENCOUNTER — Ambulatory Visit: Payer: Medicare Other | Admitting: Rehabilitation

## 2013-11-04 ENCOUNTER — Ambulatory Visit: Payer: Medicare Other | Admitting: Rehabilitation

## 2014-03-03 ENCOUNTER — Emergency Department (HOSPITAL_BASED_OUTPATIENT_CLINIC_OR_DEPARTMENT_OTHER): Payer: No Typology Code available for payment source

## 2014-03-03 ENCOUNTER — Encounter (HOSPITAL_BASED_OUTPATIENT_CLINIC_OR_DEPARTMENT_OTHER): Payer: Self-pay | Admitting: Emergency Medicine

## 2014-03-03 ENCOUNTER — Emergency Department (HOSPITAL_BASED_OUTPATIENT_CLINIC_OR_DEPARTMENT_OTHER)
Admission: EM | Admit: 2014-03-03 | Discharge: 2014-03-03 | Disposition: A | Discharge status: Home / Self Care | Payer: No Typology Code available for payment source | Attending: Emergency Medicine | Admitting: Emergency Medicine

## 2014-03-03 DIAGNOSIS — S3992XA Unspecified injury of lower back, initial encounter: Secondary | ICD-10-CM | POA: Diagnosis not present

## 2014-03-03 DIAGNOSIS — S59901A Unspecified injury of right elbow, initial encounter: Secondary | ICD-10-CM | POA: Insufficient documentation

## 2014-03-03 DIAGNOSIS — Y92410 Unspecified street and highway as the place of occurrence of the external cause: Secondary | ICD-10-CM | POA: Insufficient documentation

## 2014-03-03 DIAGNOSIS — G43909 Migraine, unspecified, not intractable, without status migrainosus: Secondary | ICD-10-CM | POA: Diagnosis not present

## 2014-03-03 DIAGNOSIS — Y998 Other external cause status: Secondary | ICD-10-CM | POA: Insufficient documentation

## 2014-03-03 DIAGNOSIS — G8929 Other chronic pain: Secondary | ICD-10-CM | POA: Insufficient documentation

## 2014-03-03 DIAGNOSIS — Z87891 Personal history of nicotine dependence: Secondary | ICD-10-CM | POA: Diagnosis not present

## 2014-03-03 DIAGNOSIS — Z791 Long term (current) use of non-steroidal anti-inflammatories (NSAID): Secondary | ICD-10-CM | POA: Diagnosis not present

## 2014-03-03 DIAGNOSIS — Y9389 Activity, other specified: Secondary | ICD-10-CM | POA: Insufficient documentation

## 2014-03-03 DIAGNOSIS — Z79899 Other long term (current) drug therapy: Secondary | ICD-10-CM | POA: Diagnosis not present

## 2014-03-03 DIAGNOSIS — M549 Dorsalgia, unspecified: Secondary | ICD-10-CM

## 2014-03-03 DIAGNOSIS — Z8739 Personal history of other diseases of the musculoskeletal system and connective tissue: Secondary | ICD-10-CM | POA: Diagnosis not present

## 2014-03-03 MED ORDER — CYCLOBENZAPRINE HCL 10 MG PO TABS
10.0000 mg | ORAL_TABLET | Freq: Once | ORAL | Status: AC
  Administered 2014-03-03: 10 mg via ORAL
  Filled 2014-03-03: qty 1

## 2014-03-03 MED ORDER — CYCLOBENZAPRINE HCL 10 MG PO TABS: 10.0000 mg | ORAL_TABLET | Freq: Three times a day (TID) | ORAL | Status: DC | PRN

## 2014-03-03 NOTE — Discharge Instructions (Signed)
Back Pain, Adult Low back pain is very common. About 1 in 5 people have back pain.The cause of low back pain is rarely dangerous. The pain often gets better over time.About half of people with a sudden onset of back pain feel better in just 2 weeks. About 8 in 10 people feel better by 6 weeks.  CAUSES Some common causes of back pain include:  Strain of the muscles or ligaments supporting the spine.  Wear and tear (degeneration) of the spinal discs.  Arthritis.  Direct injury to the back. DIAGNOSIS Most of the time, the direct cause of low back pain is not known.However, back pain can be treated effectively even when the exact cause of the pain is unknown.Answering your caregiver's questions about your overall health and symptoms is one of the most accurate ways to make sure the cause of your pain is not dangerous. If your caregiver needs more information, he or she may order lab work or imaging tests (X-rays or MRIs).However, even if imaging tests show changes in your back, this usually does not require surgery. HOME CARE INSTRUCTIONS For many people, back pain returns.Since low back pain is rarely dangerous, it is often a condition that people can learn to manageon their own.   Remain active. It is stressful on the back to sit or stand in one place. Do not sit, drive, or stand in one place for more than 30 minutes at a time. Take short walks on level surfaces as soon as pain allows.Try to increase the length of time you walk each day.  Do not stay in bed.Resting more than 1 or 2 days can delay your recovery.  Do not avoid exercise or work.Your body is made to move.It is not dangerous to be active, even though your back may hurt.Your back will likely heal faster if you return to being active before your pain is gone.  Pay attention to your body when you bend and lift. Many people have less discomfortwhen lifting if they bend their knees, keep the load close to their bodies,and  avoid twisting. Often, the most comfortable positions are those that put less stress on your recovering back.  Find a comfortable position to sleep. Use a firm mattress and lie on your side with your knees slightly bent. If you lie on your back, put a pillow under your knees.  Only take over-the-counter or prescription medicines as directed by your caregiver. Over-the-counter medicines to reduce pain and inflammation are often the most helpful.Your caregiver may prescribe muscle relaxant drugs.These medicines help dull your pain so you can more quickly return to your normal activities and healthy exercise.  Put ice on the injured area.  Put ice in a plastic bag.  Place a towel between your skin and the bag.  Leave the ice on for 15-20 minutes, 03-04 times a day for the first 2 to 3 days. After that, ice and heat may be alternated to reduce pain and spasms.  Ask your caregiver about trying back exercises and gentle massage. This may be of some benefit.  Avoid feeling anxious or stressed.Stress increases muscle tension and can worsen back pain.It is important to recognize when you are anxious or stressed and learn ways to manage it.Exercise is a great option. SEEK MEDICAL CARE IF:  You have pain that is not relieved with rest or medicine.  You have pain that does not improve in 1 week.  You have new symptoms.  You are generally not feeling well. SEEK   IMMEDIATE MEDICAL CARE IF:   You have pain that radiates from your back into your legs.  You develop new bowel or bladder control problems.  You have unusual weakness or numbness in your arms or legs.  You develop nausea or vomiting.  You develop abdominal pain.  You feel faint. Document Released: 03/18/2005 Document Revised: 09/17/2011 Document Reviewed: 07/20/2013 Exit[employer] Patient Information 2015 [employer]. This information is not intended to replace advice given to you by your health care provider. Make sure you  discuss any questions you have with your health care provider.  
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
## 2014-03-03 NOTE — ED Notes (Signed)
MD at bedside. 

## 2014-08-17 ENCOUNTER — Emergency Department (HOSPITAL_BASED_OUTPATIENT_CLINIC_OR_DEPARTMENT_OTHER): Payer: No Typology Code available for payment source

## 2014-08-17 ENCOUNTER — Emergency Department (HOSPITAL_BASED_OUTPATIENT_CLINIC_OR_DEPARTMENT_OTHER)
Admission: EM | Admit: 2014-08-17 | Discharge: 2014-08-17 | Disposition: A | Discharge status: Home / Self Care | Payer: No Typology Code available for payment source | Attending: Emergency Medicine | Admitting: Emergency Medicine

## 2014-08-17 ENCOUNTER — Encounter (HOSPITAL_BASED_OUTPATIENT_CLINIC_OR_DEPARTMENT_OTHER): Payer: Self-pay | Admitting: *Deleted

## 2014-08-17 DIAGNOSIS — M1990 Unspecified osteoarthritis, unspecified site: Secondary | ICD-10-CM | POA: Insufficient documentation

## 2014-08-17 DIAGNOSIS — Y998 Other external cause status: Secondary | ICD-10-CM | POA: Insufficient documentation

## 2014-08-17 DIAGNOSIS — Y9389 Activity, other specified: Secondary | ICD-10-CM | POA: Insufficient documentation

## 2014-08-17 DIAGNOSIS — S161XXA Strain of muscle, fascia and tendon at neck level, initial encounter: Secondary | ICD-10-CM | POA: Diagnosis not present

## 2014-08-17 DIAGNOSIS — S93401A Sprain of unspecified ligament of right ankle, initial encounter: Secondary | ICD-10-CM | POA: Insufficient documentation

## 2014-08-17 DIAGNOSIS — Z79899 Other long term (current) drug therapy: Secondary | ICD-10-CM | POA: Diagnosis not present

## 2014-08-17 DIAGNOSIS — Z791 Long term (current) use of non-steroidal anti-inflammatories (NSAID): Secondary | ICD-10-CM | POA: Insufficient documentation

## 2014-08-17 DIAGNOSIS — Y92410 Unspecified street and highway as the place of occurrence of the external cause: Secondary | ICD-10-CM | POA: Diagnosis not present

## 2014-08-17 DIAGNOSIS — S199XXA Unspecified injury of neck, initial encounter: Secondary | ICD-10-CM | POA: Diagnosis present

## 2014-08-17 DIAGNOSIS — G43909 Migraine, unspecified, not intractable, without status migrainosus: Secondary | ICD-10-CM | POA: Diagnosis not present

## 2014-08-17 DIAGNOSIS — Z87891 Personal history of nicotine dependence: Secondary | ICD-10-CM | POA: Diagnosis not present

## 2014-08-17 DIAGNOSIS — M797 Fibromyalgia: Secondary | ICD-10-CM | POA: Diagnosis not present
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
## 2014-08-17 NOTE — ED Notes (Signed)
Patient transported to X-ray 

## 2014-09-07 DIAGNOSIS — F4310 Post-traumatic stress disorder, unspecified: Secondary | ICD-10-CM | POA: Insufficient documentation

## 2014-10-17 ENCOUNTER — Encounter (HOSPITAL_BASED_OUTPATIENT_CLINIC_OR_DEPARTMENT_OTHER): Payer: Self-pay | Admitting: *Deleted

## 2014-10-17 ENCOUNTER — Emergency Department (HOSPITAL_BASED_OUTPATIENT_CLINIC_OR_DEPARTMENT_OTHER)
Admission: EM | Admit: 2014-10-17 | Discharge: 2014-10-17 | Disposition: A | Discharge status: Home / Self Care | Payer: No Typology Code available for payment source | Attending: Emergency Medicine | Admitting: Emergency Medicine

## 2014-10-17 DIAGNOSIS — G894 Chronic pain syndrome: Secondary | ICD-10-CM | POA: Diagnosis not present

## 2014-10-17 DIAGNOSIS — M5430 Sciatica, unspecified side: Secondary | ICD-10-CM | POA: Insufficient documentation

## 2014-10-17 DIAGNOSIS — G43909 Migraine, unspecified, not intractable, without status migrainosus: Secondary | ICD-10-CM | POA: Diagnosis not present

## 2014-10-17 DIAGNOSIS — M797 Fibromyalgia: Secondary | ICD-10-CM | POA: Insufficient documentation

## 2014-10-17 DIAGNOSIS — Y9389 Activity, other specified: Secondary | ICD-10-CM | POA: Insufficient documentation

## 2014-10-17 DIAGNOSIS — S3992XA Unspecified injury of lower back, initial encounter: Secondary | ICD-10-CM | POA: Insufficient documentation

## 2014-10-17 DIAGNOSIS — Z79899 Other long term (current) drug therapy: Secondary | ICD-10-CM | POA: Diagnosis not present

## 2014-10-17 DIAGNOSIS — Y92410 Unspecified street and highway as the place of occurrence of the external cause: Secondary | ICD-10-CM | POA: Insufficient documentation

## 2014-10-17 DIAGNOSIS — M545 Low back pain: Secondary | ICD-10-CM

## 2014-10-17 DIAGNOSIS — Y998 Other external cause status: Secondary | ICD-10-CM | POA: Diagnosis not present

## 2014-10-17 DIAGNOSIS — M1990 Unspecified osteoarthritis, unspecified site: Secondary | ICD-10-CM | POA: Insufficient documentation

## 2014-10-17 DIAGNOSIS — M79602 Pain in left arm: Secondary | ICD-10-CM

## 2014-10-17 DIAGNOSIS — Z791 Long term (current) use of non-steroidal anti-inflammatories (NSAID): Secondary | ICD-10-CM | POA: Insufficient documentation

## 2014-10-17 DIAGNOSIS — Z87891 Personal history of nicotine dependence: Secondary | ICD-10-CM | POA: Diagnosis not present

## 2014-10-17 DIAGNOSIS — S59912A Unspecified injury of left forearm, initial encounter: Secondary | ICD-10-CM | POA: Diagnosis present

## 2014-10-17 MED ORDER — DIAZEPAM 5 MG PO TABS
5.0000 mg | ORAL_TABLET | Freq: Once | ORAL | Status: AC
Start: 2014-10-17 — End: 2014-10-17
  Administered 2014-10-17: 5 mg via ORAL
  Filled 2014-10-17: qty 1

## 2014-10-17 MED ORDER — KETOROLAC TROMETHAMINE 60 MG/2ML IM SOLN
60.0000 mg | Freq: Once | INTRAMUSCULAR | Status: AC
  Administered 2014-10-17: 60 mg via INTRAMUSCULAR
  Filled 2014-10-17: qty 2
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
## 2015-02-01 DIAGNOSIS — G894 Chronic pain syndrome: Secondary | ICD-10-CM | POA: Insufficient documentation

## 2015-02-01 DIAGNOSIS — M7918 Myalgia, other site: Secondary | ICD-10-CM | POA: Insufficient documentation

## 2015-10-05 DIAGNOSIS — G43509 Persistent migraine aura without cerebral infarction, not intractable, without status migrainosus: Secondary | ICD-10-CM | POA: Insufficient documentation

## 2016-08-30 ENCOUNTER — Emergency Department (HOSPITAL_BASED_OUTPATIENT_CLINIC_OR_DEPARTMENT_OTHER)
Admission: EM | Admit: 2016-08-30 | Discharge: 2016-08-30 | Disposition: A | Discharge status: Home / Self Care | Payer: Medicare Other | Attending: Emergency Medicine | Admitting: Emergency Medicine

## 2016-08-30 ENCOUNTER — Encounter (HOSPITAL_BASED_OUTPATIENT_CLINIC_OR_DEPARTMENT_OTHER): Payer: Self-pay | Admitting: *Deleted

## 2016-08-30 ENCOUNTER — Emergency Department (HOSPITAL_BASED_OUTPATIENT_CLINIC_OR_DEPARTMENT_OTHER): Payer: Medicare Other

## 2016-08-30 DIAGNOSIS — Z79899 Other long term (current) drug therapy: Secondary | ICD-10-CM | POA: Insufficient documentation

## 2016-08-30 DIAGNOSIS — M79601 Pain in right arm: Secondary | ICD-10-CM | POA: Insufficient documentation

## 2016-08-30 DIAGNOSIS — M546 Pain in thoracic spine: Secondary | ICD-10-CM | POA: Diagnosis not present

## 2016-08-30 DIAGNOSIS — Z87891 Personal history of nicotine dependence: Secondary | ICD-10-CM | POA: Diagnosis not present

## 2016-08-30 MED ORDER — IBUPROFEN 800 MG PO TABS
800.0000 mg | ORAL_TABLET | Freq: Once | ORAL | Status: AC
  Administered 2016-08-30: 800 mg via ORAL
  Filled 2016-08-30: qty 1

## 2016-08-30 MED ORDER — CYCLOBENZAPRINE HCL 5 MG PO TABS
5.0000 mg | ORAL_TABLET | Freq: Once | ORAL | Status: AC
  Administered 2016-08-30: 5 mg via ORAL
  Filled 2016-08-30: qty 1

## 2016-08-30 MED ORDER — CYCLOBENZAPRINE HCL 5 MG PO TABS: 5.0000 mg | ORAL_TABLET | Freq: Three times a day (TID) | ORAL | 0 refills | Status: DC | PRN

## 2016-08-30 MED ORDER — NAPROXEN 500 MG PO TABS: 500.0000 mg | ORAL_TABLET | Freq: Two times a day (BID) | ORAL | 0 refills | Status: DC | PRN

## 2016-08-30 NOTE — ED Triage Notes (Signed)
Right arm pain x several months.  No known injury.

## 2016-08-30 NOTE — ED Notes (Signed)
Pt is tearful, expressing difficulty and exhaustion due to taking care of her disabled husband who had a stroke. Pt states she has R arm and back pain from lifting him.

## 2016-08-30 NOTE — Discharge Instructions (Signed)
Continue to seek support for your husband's care to give you break. Use medicines only as needed.  If you were given medicines take as directed.  If you are on coumadin or contraceptives realize their levels and effectiveness is altered by many different medicines.  If you have any reaction (rash, tongues swelling, other) to the medicines stop taking and see a physician.    If your blood pressure was elevated in the ER make sure you follow up for management with a primary doctor or return for chest pain, shortness of breath or stroke symptoms.  Please follow up as directed and return to the ER or see a physician for new or worsening symptoms.  Thank you. Vitals:   08/30/16 1308 08/30/16 1309  BP:  (!) 150/84  Pulse:  76  Resp:  18  Temp:  99.6 F (37.6 C)  TempSrc:  Oral  SpO2:  100%  Weight: 106.6 kg (235 lb)   Height: 5\' 3"  (1.6 m)

## 2016-08-30 NOTE — ED Triage Notes (Signed)
Pt also reports that she has pain in her upper back with movement.

## 2016-08-30 NOTE — ED Provider Notes (Signed)
[facility]   CSN: [id number] Arrival date & time: 08/30/16  1257     History   Chief Complaint Chief Complaint  Patient presents with  . Arm Pain    HPI Cheryl FlockKaren Mathews is a 51 y.o. female.  Patient presents for right arm pain and right back pain. Patient's been taking care of her husband since his stroke is been lifting a multiple times and now has pain with palpation range of motion. Patient has fibromyalgia as well as anxiety. Patient tearful due to exhaustion. Patient does have some help from her sons. Patient is had multiple small injuries to the forearm but not one significant injury. No swelling or fevers.      Past Medical History:  Diagnosis Date  . Back pain   . Fibromyalgia   . Migraine   . Osteoarthritis     Patient Active Problem List   Diagnosis Date Noted  . Neck pain 09/13/2013  . Low back pain 09/13/2013    Past Surgical History:  Procedure Laterality Date  . arms Bilateral    Carpal tunnel release  . R ring finger surgery      OB History    No data available       Home Medications    Prior to Admission medications   Medication Sig Start Date End Date Taking? Authorizing Provider  cyclobenzaprine (FLEXERIL) 5 MG tablet Take 1 tablet (5 mg total) by mouth 3 (three) times daily as needed for muscle spasms. 08/30/16   Blane OharaZavitz, Math Brazie, MD  gabapentin (NEURONTIN) 800 MG tablet  08/06/13   [provider]  HYDROcodone-acetaminophen [facility](NORCO) 10-325 MG per tablet  09/20/13   [provider]  medroxyPROGESTERone (DEPO-PROVERA) 150 MG/ML injection  08/31/13   [provider]  naproxen (NAPROSYN) 500 MG tablet Take 1 tablet (500 mg total) by mouth 2 (two) times daily as needed. 08/30/16   Blane OharaZavitz, Adell Panek, MD    Family History History reviewed. No pertinent family history.  Social History Social History  Substance Use Topics  . Smoking status: Former Games developermoker  . Smokeless tobacco: Never Used  . Alcohol use  Yes     Allergies   Tylenol [acetaminophen]   Review of Systems Review of Systems  Constitutional: Negative for fever.  Respiratory: Negative for cough and shortness of breath.   Cardiovascular: Negative for chest pain and leg swelling.  Musculoskeletal: Positive for arthralgias and back pain.  Neurological: Negative for weakness and numbness.     Physical Exam Updated Vital Signs BP (!) 150/84 (BP Location: Left Arm)   Pulse 76   Temp 99.6 F (37.6 C) (Oral)   Resp 18   Ht 5\' 3"  (1.6 m)   Wt 106.6 kg (235 lb)   LMP 08/16/2016   SpO2 100%   BMI 41.63 kg/m   Physical Exam  Constitutional: She appears well-developed and well-nourished.  HENT:  Head: Normocephalic.  Pulmonary/Chest: Effort normal.  Abdominal: Soft. She exhibits no distension. There is no tenderness.  Musculoskeletal:  Patient has mild tenderness without deformity right lateral forearm no swelling compartment soft. Neurovascularly intact distal right arm. Patient is mild tenderness paraspinal lower thoracic and the right. Tight musculature. Reproducible tenderness. No midline tenderness.  Neurological: She is alert.  Skin: Skin is warm.  Psychiatric: Her mood appears anxious.  tearful     ED Treatments / Results  Labs (all labs ordered are listed, but only abnormal results are displayed) Labs Reviewed - No data to display  EKG  EKG Interpretation None       Radiology No results found.  Procedures Procedures (including critical care time)  Medications Ordered in ED Medications  cyclobenzaprine (FLEXERIL) tablet 5 mg (5 mg Oral Given 08/30/16 1356)  ibuprofen (ADVIL,MOTRIN) tablet 800 mg (800 mg Oral Given 08/30/16 1356)     Initial Impression / Assessment and Plan / ED Course  I have reviewed the triage vital signs and the nursing notes.  Pertinent labs & imaging results that were available during my care of the patient were reviewed by me and considered in my medical decision making  (see chart for details).    Patient presents with clinically musculoskeletal injuries and strain since taking care of her husband. X-ray unremarkable. Discussed supportive care.  Results and differential diagnosis were discussed with the patient/parent/guardian. Xrays were independently reviewed by myself.  Close follow up outpatient was discussed, comfortable with the plan.   Medications  cyclobenzaprine (FLEXERIL) tablet 5 mg (5 mg Oral Given 08/30/16 1356)  ibuprofen (ADVIL,MOTRIN) tablet 800 mg (800 mg Oral Given 08/30/16 1356)    Vitals:   08/30/16 1308 08/30/16 1309  BP:  (!) 150/84  Pulse:  76  Resp:  18  Temp:  99.6 F (37.6 C)  TempSrc:  Oral  SpO2:  100%  Weight: 106.6 kg (235 lb)   Height: 5\' 3"  (1.6 m)     Final diagnoses:  Right arm pain  Acute right-sided thoracic back pain     Final Clinical Impressions(s) / ED Diagnoses   Final diagnoses:  Right arm pain  Acute right-sided thoracic back pain    New Prescriptions New Prescriptions   CYCLOBENZAPRINE (FLEXERIL) 5 MG TABLET    Take 1 tablet (5 mg total) by mouth 3 (three) times daily as needed for muscle spasms.   NAPROXEN (NAPROSYN) 500 MG TABLET    Take 1 tablet (500 mg total) by mouth 2 (two) times daily as needed.     Blane Ohara, MD 08/30/16 [phone]

## 2017-01-09 ENCOUNTER — Ambulatory Visit: Payer: Medicare Other | Admitting: Neurology

## 2017-03-03 ENCOUNTER — Encounter: Payer: Self-pay | Admitting: *Deleted

## 2017-03-04 ENCOUNTER — Encounter: Payer: Self-pay | Admitting: Neurology

## 2017-03-04 ENCOUNTER — Other Ambulatory Visit: Payer: Self-pay

## 2017-03-04 ENCOUNTER — Ambulatory Visit (INDEPENDENT_AMBULATORY_CARE_PROVIDER_SITE_OTHER): Payer: Medicare Other | Admitting: Neurology

## 2017-03-04 DIAGNOSIS — R51 Headache: Secondary | ICD-10-CM | POA: Diagnosis not present

## 2017-03-04 DIAGNOSIS — F410 Panic disorder [episodic paroxysmal anxiety] without agoraphobia: Secondary | ICD-10-CM | POA: Diagnosis not present

## 2017-03-04 DIAGNOSIS — R519 Headache, unspecified: Secondary | ICD-10-CM

## 2017-03-04 DIAGNOSIS — M797 Fibromyalgia: Secondary | ICD-10-CM

## 2017-03-04 HISTORY — DX: Headache, unspecified: R51.9

## 2017-03-04 HISTORY — DX: Panic disorder (episodic paroxysmal anxiety): F41.0

## 2017-03-04 MED ORDER — DULOXETINE HCL 30 MG PO CPEP: 30.0000 mg | ORAL_CAPSULE | Freq: Every day | ORAL | 3 refills | Status: DC

## 2017-03-04 MED ORDER — CLONAZEPAM 0.5 MG PO TABS: 0.5000 mg | ORAL_TABLET | Freq: Two times a day (BID) | ORAL | 3 refills | Status: AC

## 2017-03-04 NOTE — Patient Instructions (Signed)
   We will start Cymbalta 30 mg one a day for 2 weeks, then take one twice a day.  We will start clonazepam 0.5 mg twice a day.  Cymbalta (duloxetine) is an antidepressant medication that is commonly used for peripheral neuropathy pain or for fibromyalgia pain. As with any antidepressant medication, worsening depression can be seen. This medication can potentially cause headache, dizziness, sexual dysfunction, or nausea. If any problems are noted on this medication, please contact our office.
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
## 2017-06-26 ENCOUNTER — Encounter: Payer: Self-pay | Admitting: Adult Health

## 2017-06-26 ENCOUNTER — Ambulatory Visit: Payer: Medicare Other | Admitting: Adult Health

## 2017-10-07 DIAGNOSIS — R072 Precordial pain: Secondary | ICD-10-CM | POA: Insufficient documentation

## 2017-10-07 DIAGNOSIS — R002 Palpitations: Secondary | ICD-10-CM | POA: Insufficient documentation

## 2019-08-26 ENCOUNTER — Other Ambulatory Visit: Payer: Self-pay | Admitting: Physician Assistant

## 2019-08-26 DIAGNOSIS — Z1231 Encounter for screening mammogram for malignant neoplasm of breast: Secondary | ICD-10-CM

## 2019-10-06 ENCOUNTER — Other Ambulatory Visit: Payer: Self-pay

## 2019-10-06 ENCOUNTER — Ambulatory Visit: Payer: Medicare Other

## 2019-10-06 ENCOUNTER — Ambulatory Visit
Admission: RE | Admit: 2019-10-06 | Discharge: 2019-10-06 | Disposition: A | Discharge status: Home / Self Care | Payer: Medicare Other | Source: Ambulatory Visit | Attending: Physician Assistant | Admitting: Physician Assistant

## 2019-10-06 DIAGNOSIS — Z1231 Encounter for screening mammogram for malignant neoplasm of breast: Secondary | ICD-10-CM

## 2020-09-25 ENCOUNTER — Other Ambulatory Visit: Payer: Self-pay | Admitting: Family Medicine

## 2020-09-25 DIAGNOSIS — Z1231 Encounter for screening mammogram for malignant neoplasm of breast: Secondary | ICD-10-CM

## 2021-11-30 DIAGNOSIS — R569 Unspecified convulsions: Secondary | ICD-10-CM | POA: Insufficient documentation

## 2022-02-20 DIAGNOSIS — R590 Localized enlarged lymph nodes: Secondary | ICD-10-CM | POA: Insufficient documentation

## 2022-03-11 DIAGNOSIS — D869 Sarcoidosis, unspecified: Secondary | ICD-10-CM | POA: Insufficient documentation

## 2022-03-22 ENCOUNTER — Ambulatory Visit (HOSPITAL_BASED_OUTPATIENT_CLINIC_OR_DEPARTMENT_OTHER): Payer: Medicare Other | Admitting: Psychiatry

## 2022-03-22 ENCOUNTER — Ambulatory Visit (HOSPITAL_COMMUNITY): Payer: Medicare Other | Admitting: Psychiatry

## 2022-03-22 ENCOUNTER — Encounter (HOSPITAL_COMMUNITY): Payer: Self-pay | Admitting: Psychiatry

## 2022-03-22 VITALS — Wt 222.0 lb

## 2022-03-22 DIAGNOSIS — F331 Major depressive disorder, recurrent, moderate: Secondary | ICD-10-CM

## 2022-03-22 DIAGNOSIS — F419 Anxiety disorder, unspecified: Secondary | ICD-10-CM

## 2022-03-22 DIAGNOSIS — M797 Fibromyalgia: Secondary | ICD-10-CM

## 2022-03-22 MED ORDER — DULOXETINE HCL 30 MG PO CPEP: 30.0000 mg | ORAL_CAPSULE | Freq: Every day | ORAL | 0 refills | Status: AC
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
## 2022-04-19 ENCOUNTER — Telehealth (HOSPITAL_BASED_OUTPATIENT_CLINIC_OR_DEPARTMENT_OTHER): Payer: Medicare HMO | Admitting: Psychiatry

## 2022-04-19 ENCOUNTER — Encounter (HOSPITAL_COMMUNITY): Payer: Self-pay | Admitting: Psychiatry

## 2022-04-19 VITALS — Wt 244.0 lb

## 2022-04-19 DIAGNOSIS — F419 Anxiety disorder, unspecified: Secondary | ICD-10-CM | POA: Diagnosis not present

## 2022-04-19 DIAGNOSIS — F331 Major depressive disorder, recurrent, moderate: Secondary | ICD-10-CM

## 2022-04-19 NOTE — Progress Notes (Signed)
Virtual Visit via Video Note  I connected with Cheryl Mathews on 04/19/22 at 11:00 AM EST by a video enabled telemedicine application and verified that I am speaking with the correct person using two identifiers.  Location: Patient: Parking Lot Provider: Home Office   I discussed the limitations of evaluation and management by telemedicine and the availability of in person appointments. The patient expressed understanding and agreed to proceed.  History of Present Illness: Patient is 57 year old African-American, married female who was seen first time 4 weeks ago.  She was very upset because her provider referred to see psychiatrist but did not provide the reason.  She had a history of depression and anxiety and she was prescribed medication not having an issue with this new provider.  She was not able to provide the details of the medication that she has been taking.  We have recommended to get the records from the [facility] and so far we have not received.  Patient told she had another visit with the provider at [facility] and they were able to change to her previous provider which she is very comfortable with.  She is now seeing Jeanella Anton and her medicines were given recently by her.  We also started Cymbalta but after taking a few days she started to have nausea and she threw up and stopped the medication.  Today patient told that she is taking venlafaxine which was given by Jeanella Anton that she forgot to tell us about.  She is doing better now since she has more comfortable with the provider.  She also getting Xanax 0.5 mg 2 times a day.  She is able to get her hydrocodone.  She denies any crying spells, agitated, mood swings, irritable or any feeling of hopelessness or worthlessness.  She is still not sure why they referred her to see a psychiatrist.  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  Patient  has headaches, history of fall, fibromyalgia, chronic pain.  She preferred to stay at 1 place to get a hold of her medication and health needs taken care of.  She had a good support from her son.  She has 3 son 2 of them live outside and 1 son lives with them.  Today patient appears more calm and pleasant but is still have difficulty remembering the previous medication details.  When I ask about other psychotropic medication patient told that she is not taking any other medicine for anxiety or depression.  As per chart she has given Zoloft, Seroquel, BuSpar, Klonopin and Cymbalta.  She feels her depression is stable.  He denies any major panic attack.  She is not seeing any therapist but like to consider appointment with a counselor.  Her appetite is okay.  She sleeps good.   Past Psychiatric History: H/O depression and anxiety. No history of suicidal attempt, drug use, hallucination, paranoia or inpatient treatment.   As per chart given Cymbalta, Seroquel, BuSpar, Zoloft, venlafaxine, Xanax, Klonopin. Cymbalta cause nausea.   Psychiatric Specialty Exam: Physical Exam  Review of Systems  Weight 244 lb (110.7 kg).There is no height or weight on file to calculate BMI.  General Appearance: Casual  Eye Contact:  Fair  Speech:   fast but clear  Volume:  Normal  Mood:  Anxious  Affect:  Congruent  Thought Process:  Goal Directed  Orientation:  Full (Time, Place, and Person)  Thought Content:  Rumination  Suicidal Thoughts:  No  Homicidal Thoughts:  No  Memory:  Immediate;   Fair Recent;   Fair Remote;   Fair  Judgement:  Fair  Insight:  Present  Psychomotor Activity:  Increased  Concentration:  Concentration: Fair and Attention Span: Fair  Recall:  [employer] of Knowledge:  Fair  Language:  Good  Akathisia:  No  Handed:  Right  AIMS (if indicated):     Assets:  Communication Skills Desire for Improvement Housing Social Support  ADL's:  Intact  Cognition:  WNL  Sleep:   fair, need  CPAP machine.    Assessment and Plan: Major depressive disorder, recurrent.  Anxiety.  Discussed about current psychotropic medication who now given by her provider Jeanella Anton at [facility].  She is also getting the pain medication from [facility] and now like to keep them for chronic health needs and psychiatric symptoms.  I agree with the plan.  She is not taking the Cymbalta.  She is taking venlafaxine 75 mg a day and Xanax 0.5 mg 2 times a day which are given by provider at [facility].  Discuss to consider therapy patient agreed with the plan.  We will provide referral for therapy.  I recommend if she decided to schedule appointment with a psychiatrist in our office then she can call us.  Discussed safety concern that anytime having active suicidal thoughts or homicidal thoughts then she need to call 911 or go to local emergency room.  No need to schedule appointment.    Follow Up Instructions:    I discussed the assessment and treatment plan with the patient. The patient was provided an opportunity to ask questions and all were answered. The patient agreed with the plan and demonstrated an understanding of the instructions.  Collaboration of Care: Other provider involved in patient's care AEB notes are available in epic to review.  Patient/Guardian was advised Release of Information must be obtained prior to any record release in order to collaborate their care with an outside provider. Patient/Guardian was advised if they have not already done so to contact the registration department to sign all necessary forms in order for [place] to release information regarding their care.   Consent: Patient/Guardian gives verbal consent for treatment and assignment of benefits for services provided during this visit. Patient/Guardian expressed understanding and agreed to proceed.     The patient was advised to call back or seek an in-person evaluation if the symptoms  worsen or if the condition fails to improve as anticipated.  I provided 30 minutes of non-face-to-face time during this encounter.   Kathlee Nations, MD

## 2022-04-20 ENCOUNTER — Other Ambulatory Visit (HOSPITAL_COMMUNITY): Payer: Self-pay | Admitting: Psychiatry

## 2022-04-20 DIAGNOSIS — F331 Major depressive disorder, recurrent, moderate: Secondary | ICD-10-CM

## 2022-04-20 DIAGNOSIS — F419 Anxiety disorder, unspecified: Secondary | ICD-10-CM

## 2022-04-20 DIAGNOSIS — M797 Fibromyalgia: Secondary | ICD-10-CM

## 2022-05-02 ENCOUNTER — Ambulatory Visit (HOSPITAL_COMMUNITY): Payer: Medicare HMO | Admitting: Clinical

## 2023-04-07 ENCOUNTER — Other Ambulatory Visit: Payer: Self-pay

## 2023-04-07 ENCOUNTER — Ambulatory Visit (INDEPENDENT_AMBULATORY_CARE_PROVIDER_SITE_OTHER): Payer: Medicare HMO | Admitting: Otolaryngology

## 2023-04-07 ENCOUNTER — Other Ambulatory Visit (HOSPITAL_BASED_OUTPATIENT_CLINIC_OR_DEPARTMENT_OTHER): Payer: Self-pay

## 2023-04-07 ENCOUNTER — Emergency Department (HOSPITAL_BASED_OUTPATIENT_CLINIC_OR_DEPARTMENT_OTHER)
Admission: EM | Admit: 2023-04-07 | Discharge: 2023-04-07 | Disposition: A | Discharge status: Home / Self Care | Payer: Medicare HMO | Attending: Emergency Medicine | Admitting: Emergency Medicine

## 2023-04-07 ENCOUNTER — Encounter (HOSPITAL_BASED_OUTPATIENT_CLINIC_OR_DEPARTMENT_OTHER): Payer: Self-pay | Admitting: Emergency Medicine

## 2023-04-07 VITALS — BP 160/103 | HR 105 | Ht 63.0 in | Wt 218.0 lb

## 2023-04-07 DIAGNOSIS — W44F4XA Insect entering into or through a natural orifice, initial encounter: Secondary | ICD-10-CM | POA: Insufficient documentation

## 2023-04-07 DIAGNOSIS — T161XXA Foreign body in right ear, initial encounter: Secondary | ICD-10-CM

## 2023-04-07 DIAGNOSIS — H9201 Otalgia, right ear: Secondary | ICD-10-CM | POA: Diagnosis not present

## 2023-04-07 MED ORDER — CIPROFLOXACIN-DEXAMETHASONE 0.3-0.1 % OT SUSP
4.0000 [drp] | Freq: Two times a day (BID) | OTIC | 0 refills | Status: AC
  Filled 2023-04-07: qty 7.5, 19d supply, fill #0

## 2023-04-07 MED ORDER — LIDOCAINE VISCOUS HCL 2 % SOLUTION FOR USE IN EAR (ED/BUG EXTRACTION)
15.0000 mL | Freq: Once | OROMUCOSAL | Status: AC
  Administered 2023-04-07: 15 mL via OTIC
  Filled 2023-04-07: qty 15

## 2023-04-07 NOTE — Progress Notes (Signed)
 Patient ID: Cheryl Mathews, female   DOB: February 04, 1966, 58 y.o.   MRN: [id number]  CC: Right ear pain, right ear foreign body  HPI:  Cheryl Mathews is a 58 y.o. female who presents today complaining of right ear pain since this morning.  She suspected an insect flew into her right ear.  She was evaluated at the [facility] health emergency room and [facility].  An insect was noted within the right ear canal.  Multiple attempts to remove the foreign body was unsuccessful.  It resulted in significant right ear pain.  The patient has no previous history of otitis media or otitis externa.  She has no previous otologic surgery.  She denies any hearing difficulty.  Past Medical History:  Diagnosis Date   Arthritis    Back pain    Chronic pain syndrome    Fibromyalgia    Headache disorder 03/04/2017   Migraine    Osteoarthritis    Panic disorder 03/04/2017    Past Surgical History:  Procedure Laterality Date   arms Bilateral    Carpal tunnel release   CARPAL TUNNEL RELEASE     R ring finger surgery      Family History  Problem Relation Age of Onset   High blood pressure Mother    Diabetes Mother    Heart Problems Father     Social History:  reports that she has quit smoking. She has never used smokeless tobacco. She reports current alcohol use. She reports current drug use. Drug: Marijuana.  Allergies:  Allergies  Allergen Reactions   Pregabalin Other (See Comments)   Pseudoephedrine-Acetaminophen  Swelling   Tylenol  [Acetaminophen ] Hives    Tylenol  sinus   Fentanyl Rash   Tapentadol Anxiety and Nausea And Vomiting    Prior to Admission medications   Medication Sig Start Date End Date Taking? Authorizing Provider  ALPRAZolam (XANAX) 0.5 MG tablet Take by mouth. 11/26/21  Yes [provider]  busPIRone (BUSPAR) 7.5 MG tablet Take by mouth. 09/30/17  Yes [provider]  ciprofloxacin -dexamethasone  (CIPRODEX ) OTIC suspension Place 4 drops into the right ear 2 (two)  times daily. 04/07/23  Yes Schutt, Marsa HERO, PA-C  clonazePAM  (KLONOPIN ) 0.5 MG tablet Take 1 tablet (0.5 mg total) by mouth 2 (two) times daily. 03/04/17  Yes Jenel Carlin POUR, MD  DULoxetine  (CYMBALTA ) 30 MG capsule Take 1 capsule (30 mg total) by mouth daily. 03/22/22  Yes Arfeen, Leni DASEN, MD  gabapentin (NEURONTIN) 600 MG tablet Take 600 mg by mouth 3 (three) times daily.   Yes [provider]  HYDROcodone -acetaminophen  (NORCO) 10-325 MG tablet Take 1 tablet by mouth every 8 (eight) hours as needed for moderate pain.   Yes Grossman, Janelle, NP  medroxyPROGESTERone Acetate 150 MG/ML SUSY  07/25/14  Yes [provider]  metoprolol succinate (TOPROL-XL) 50 MG 24 hr tablet Take by mouth. 02/18/22  Yes [provider]  pregabalin (LYRICA) 75 MG capsule Take by mouth. 10/23/16  Yes [provider]  propafenone (RYTHMOL) 150 MG tablet Take 150 mg by mouth 3 (three) times daily. 03/19/22  Yes [provider]  QUEtiapine (SEROQUEL) 25 MG tablet Take by mouth. 10/11/21  Yes [provider]  sertraline (ZOLOFT) 50 MG tablet  08/27/13  Yes [provider]  venlafaxine XR (EFFEXOR-XR) 75 MG 24 hr capsule Take by mouth. 11/18/21  Yes [provider]    Blood pressure (!) 160/103, pulse (!) 105, height 5' 3 (1.6 m), weight 218 lb (98.9 kg), last  menstrual period 08/16/2016, SpO2 95%. Exam: General: Communicates without difficulty, well nourished, no acute distress. Head: Normocephalic, no evidence injury, no tenderness, facial buttresses intact without stepoff. Face/sinus: No tenderness to palpation and percussion. Facial movement is normal and symmetric. Eyes: PERRL, EOMI. No scleral icterus, conjunctivae clear. Neuro: CN II exam reveals vision grossly intact.  No nystagmus at any point of gaze. Ears: Auricles well formed without lesions.  An insect is noted within the medial aspect of the right ear canal.  The left ear canal, tympanic  membrane, and middle ear space are normal.  Nose: External evaluation reveals normal support and skin without lesions.  Dorsum is intact.  Anterior rhinoscopy reveals normal mucosa over anterior aspect of inferior turbinates and intact septum.  No purulence noted. Oral:  Oral cavity and oropharynx are intact, symmetric, without erythema or edema.  Mucosa is moist without lesions. Neck: Full range of motion without pain.  There is no significant lymphadenopathy.  No masses palpable.  Thyroid bed within normal limits to palpation.  Parotid glands and submandibular glands equal bilaterally without mass.  Trachea is midline. Neuro:  CN 2-12 grossly intact.   Procedure: Right ear foreign body removal.   Anesthesia: None Description of the procedure: The patient is placed on a supine position.  Under the operating microscope, the right ear canal is examined.  An insect is noted to be impacted on the medial aspect of the ear canal.  Under the microscope, the foreign body is carefully removed with an alligator forceps and a 90 hook.  After foreign body removal, the tympanic membrane and middle ear space are noted to be normal.  The patient tolerated the procedure well.   Assessment: 1.  The patient is noted to have a right ear canal foreign body.  An insect is noted to be impacted within the medial aspect of the right ear canal.  This is likely likely source of her right otalgia. 2.  After the foreign body removal procedure, both tympanic membranes and middle ear spaces are noted to be normal.  Plan: 1.  Otomicroscopy with right ear foreign body removal. 2.  The physical exam findings are reviewed with the patient. 3.  The patient is reassured that no infection is noted today. 4.  The patient is encouraged to call with any questions or concerns.  Shauniece Kwan W Sharniece Gibbon 04/07/2023, 2:18 PM

## 2023-04-07 NOTE — Discharge Instructions (Addendum)
 You have been seen today for your complaint of insect inside right ear. Your discharge medications include Ciprodex .  This is an antibiotic drop.  Apply to the right ear daily until you are able to follow-up with the ear doctors. Follow up with: [facility] health otolaryngology.  These are the ear doctors.  Call as soon as possible to schedule follow-up appointment.  I have included the phone number in your discharge paperwork. Please seek immediate medical care if you develop any of the following symptoms: You notice blood or fluid coming from your ear. You have sudden hearing loss. At this time there does not appear to be the presence of an emergent medical condition, however there is always the potential for conditions to change. Please read and follow the below instructions.  Do not take your medicine if  develop an itchy rash, swelling in your mouth or lips, or difficulty breathing; call 911 and seek immediate emergency medical attention if this occurs.  You may review your lab tests and imaging results in their entirety on your MyChart account.  Please discuss all results of fully with your primary care provider and other specialist at your follow-up visit.  Note: Portions of this text may have been transcribed using voice recognition software. Every effort was made to ensure accuracy; however, inadvertent computerized transcription errors may still be present.

## 2023-04-07 NOTE — ED Provider Notes (Signed)
 [place] EMERGENCY DEPARTMENT AT [facility] Provider Note   CSN: [id number] Arrival date & time: 04/07/23  0854     History  Chief Complaint  Patient presents with   Foreign Body in Ear    Cheryl Mathews is a 58 y.o. female.  With a history of fibromyalgia, panic disorder, osteoarthritis presenting to the ED for evaluation of right ear pain.  She reports she feels like there is a bug stuck in her ear.  Reports this occurred approximately 30 minutes prior to arrival.  Says she has been staying at a friend's house to recently had her house.  For cockroaches.  She denies any left ear pain.  No drainage from the right ear.  No sore throat.  Reports she can feel the bug crawling around.  HPI     Home Medications Prior to Admission medications   Medication Sig Start Date End Date Taking? Authorizing Provider  ciprofloxacin -dexamethasone  (CIPRODEX ) OTIC suspension Place 4 drops into the right ear 2 (two) times daily. 04/07/23  Yes [place], Marsa HERO, PA-C  ALPRAZolam (XANAX) 0.5 MG tablet Take by mouth. 11/26/21   [provider]  busPIRone (BUSPAR) 7.5 MG tablet Take by mouth. Patient not taking: Reported on 04/19/2022 09/30/17   [provider]  clonazePAM  (KLONOPIN ) 0.5 MG tablet Take 1 tablet (0.5 mg total) by mouth 2 (two) times daily. Patient not taking: Reported on 04/19/2022 03/04/17   Jenel Carlin POUR, MD  DULoxetine  (CYMBALTA ) 30 MG capsule Take 1 capsule (30 mg total) by mouth daily. Patient not taking: Reported on 04/19/2022 03/22/22   Arfeen, Leni DASEN, MD  gabapentin (NEURONTIN) 600 MG tablet Take 600 mg by mouth 3 (three) times daily.    [provider]  HYDROcodone -acetaminophen  (NORCO) 10-325 MG tablet Take 1 tablet by mouth every 8 (eight) hours as needed for moderate pain.    Grossman, Janelle, NP  medroxyPROGESTERone Acetate 150 MG/ML SUSY  07/25/14   [provider]  metoprolol succinate (TOPROL-XL) 50 MG 24 hr tablet Take by mouth.  02/18/22   [provider]  pregabalin (LYRICA) 75 MG capsule Take by mouth. Patient not taking: Reported on 04/19/2022 10/23/16   [provider]  propafenone (RYTHMOL) 150 MG tablet Take 150 mg by mouth 3 (three) times daily. 03/19/22   [provider]  QUEtiapine (SEROQUEL) 25 MG tablet Take by mouth. Patient not taking: Reported on 04/19/2022 10/11/21   [provider]  sertraline (ZOLOFT) 50 MG tablet  08/27/13   [provider]  venlafaxine XR (EFFEXOR-XR) 75 MG 24 hr capsule Take by mouth. 11/18/21   [provider]      Allergies    Pregabalin, Pseudoephedrine-acetaminophen , Tylenol  [acetaminophen ], Fentanyl, and Tapentadol    Review of Systems   Review of Systems  HENT:  Positive for ear pain.   All other systems reviewed and are negative.   Physical Exam Updated Vital Signs BP 102/79   Pulse 99   Temp 98.6 F (37 C)   Resp 18   Wt 98.9 kg   LMP 08/16/2016   SpO2 99%   BMI 38.01 kg/m  Physical Exam Vitals and nursing note reviewed.  Constitutional:      General: She is not in acute distress.    Appearance: Normal appearance. She is normal weight. She is not ill-appearing.  HENT:     Head: Normocephalic and atraumatic.     Ears:     Comments: Left canal and TM within normal limits.  Right canal with insect blocking visualization of TM.  No purulent discharge.  No blood. Pulmonary:     Effort: Pulmonary effort is normal. No respiratory distress.  Abdominal:     General: Abdomen is flat.  Musculoskeletal:        General: Normal range of motion.     Cervical back: Neck supple.  Skin:    General: Skin is warm and dry.  Neurological:     Mental Status: She is alert and oriented to person, place, and time.  Psychiatric:        Mood and Affect: Mood normal.        Behavior: Behavior normal.     ED Results / Procedures / Treatments   Labs (all labs ordered are listed, but only abnormal results are  displayed) Labs Reviewed - No data to display  EKG None  Radiology No results found.  Procedures .Foreign Body Removal  Date/Time: 04/07/2023 10:52 AM  Performed by: Edwardo Marsa HERO, PA-C Authorized by: Edwardo Marsa HERO, PA-C  Consent: Verbal consent obtained. Consent given by: patient Patient understanding: patient states understanding of the procedure being performed Patient consent: the patient's understanding of the procedure matches consent given Procedure consent: procedure consent matches procedure scheduled Relevant documents: relevant documents present and verified Test results: test results available and properly labeled Patient identity confirmed: verbally with patient and arm band Time out: Immediately prior to procedure a time out was called to verify the correct patient, procedure, equipment, support staff and site/side marked as required. Body area: ear Location details: right ear Anesthesia: see MAR for details  Anesthesia: Local Anesthetic: topical anesthetic Localization method: visualized Removal mechanism: forceps and irrigation Complexity: simple Objects recovered: hard-shelled insect Post-procedure assessment: residual foreign bodies remain Comments: Procedure terminated at patient request      Medications Ordered in ED Medications  lidocaine  (XYLOCAINE ) viscous 2% for use in ear (bug extraction) (15 mLs OTIC (EAR) Given 04/07/23 9090)    ED Course/ Medical Decision Making/ A&P Clinical Course as of 04/07/23 1053  Mon Apr 07, 2023  1039 Discussed case with Dr. Roark with [facility] who recommends Ciprodex , follow-up in office [AS]    Clinical Course User Index [AS] Edwardo Marsa HERO, PA-C                                 Medical Decision Making This patient presents to the ED for concern of foreign body in right ear, this involves an extensive number of treatment options, and is a complaint that carries with it a high risk of  complications and morbidity.  The differential diagnosis includes retained foreign body, otitis media, otitis externa, perforated TM, mastoiditis, ear canal trauma  My initial workup includes viscous lidocaine  to kill the insect, extraction  Additional history obtained from: Nursing notes from this visit.  Afebrile, hemodynamically stable.  58 year old female presenting for evaluation of foreign body sensation to the right ear.  This occurred just 30 minutes prior to arrival.  States she feels like she felt a bug crawl into her right ear.  No left ear complaints.  No changes in hearing.  On exam, there does appear to be an insect lodged in the right ear canal.  Viscous lidocaine  was instilled into the ear canal to kill the insect.  Copious irrigation was then attempted without change in condition.  An alligator forceps were then used and approximately half of the insect was  extracted, however portion remained adjacent to the tympanic membrane.  Patient requested termination of procedure at this point.  There is some minimal bleeding to the ear canal.  Discussed case with ENT Dr. Roark who recommends Ciprodex , follow-up in office for complete removal.  Discussed this with the patient who is in agreement with plan.  Prescription sent.  She was given contact information for ENT and encouraged to follow-up as soon as possible.  She was given return precautions.  Stable discharge.  At this time there does not appear to be any evidence of an acute emergency medical condition and the patient appears stable for discharge with appropriate outpatient follow up. Diagnosis was discussed with patient who verbalizes understanding of care plan and is agreeable to discharge. I have discussed return precautions with patient and son who verbalizes understanding. Patient encouraged to follow-up with ENT as soon as possible. All questions answered.  Patient's case discussed with Dr. Charlyn who agrees with plan to discharge  with follow-up.   Note: Portions of this report may have been transcribed using voice recognition software. Every effort was made to ensure accuracy; however, inadvertent computerized transcription errors may still be present.        Final Clinical Impression(s) / ED Diagnoses Final diagnoses:  Foreign body of right ear, initial encounter    Rx / DC Orders ED Discharge Orders          Ordered    ciprofloxacin -dexamethasone  (CIPRODEX ) OTIC suspension  2 times daily        04/07/23 [address], PA-C 04/07/23 1053    Charlyn Sora, MD 04/08/23 [phone]

## 2023-04-07 NOTE — ED Triage Notes (Signed)
 Reports felt bug going to right ear , sts feels it moving .

## 2023-08-28 ENCOUNTER — Other Ambulatory Visit (HOSPITAL_COMMUNITY): Payer: Self-pay | Admitting: Physician Assistant

## 2023-08-28 DIAGNOSIS — R911 Solitary pulmonary nodule: Secondary | ICD-10-CM

## 2023-09-08 ENCOUNTER — Encounter (HOSPITAL_COMMUNITY)
Admission: RE | Admit: 2023-09-08 | Discharge: 2023-09-08 | Disposition: A | Discharge status: Home / Self Care | Source: Ambulatory Visit | Attending: Physician Assistant | Admitting: Physician Assistant

## 2023-09-08 DIAGNOSIS — R911 Solitary pulmonary nodule: Secondary | ICD-10-CM | POA: Diagnosis present

## 2023-09-08 LAB — GLUCOSE, CAPILLARY: Glucose-Capillary: 101 mg/dL — ABNORMAL HIGH (ref 70–99)

## 2023-09-08 MED ORDER — FLUDEOXYGLUCOSE F - 18 (FDG) INJECTION
10.8200 | Freq: Once | INTRAVENOUS | Status: AC
Start: 2023-09-08 — End: 2023-09-08
  Administered 2023-09-08: 10.82 via INTRAVENOUS

## 2024-01-05 ENCOUNTER — Ambulatory Visit (HOSPITAL_BASED_OUTPATIENT_CLINIC_OR_DEPARTMENT_OTHER)
Admission: RE | Admit: 2024-01-05 | Discharge: 2024-01-05 | Disposition: A | Discharge status: Home / Self Care | Source: Ambulatory Visit

## 2024-01-05 ENCOUNTER — Ambulatory Visit (INDEPENDENT_AMBULATORY_CARE_PROVIDER_SITE_OTHER)

## 2024-01-05 VITALS — BP 160/80 | Ht 63.0 in | Wt 237.0 lb

## 2024-01-05 DIAGNOSIS — M222X1 Patellofemoral disorders, right knee: Secondary | ICD-10-CM

## 2024-01-05 MED ORDER — NAPROXEN 500 MG PO TABS: 500.0000 mg | ORAL_TABLET | Freq: Two times a day (BID) | ORAL | 1 refills | Status: DC

## 2024-01-05 NOTE — Patient Instructions (Signed)
 VISIT SUMMARY: Today, we discussed your knee pain and its impact on your daily activities. We have a plan to manage your pain and improve your knee function.   YOUR PLAN: RIGHT KNEE PATELLOFEMORAL PAIN SYNDROME: You have intermittent sharp pain in your right knee, especially when going down stairs. This is likely due to muscle imbalances causing your kneecap to misalign.  -We will get an x-ray of your right knee to rule out arthritis and confirm the diagnosis.  -You will start taking naproxen  to help with inflammation and pain.  -You will begin physical therapy to correct muscle imbalances in your quadriceps and hip.  -We will reassess your condition in 8 weeks to see how the treatment is working.   RULE OUT RIGHT KNEE OSTEOARTHRITIS: We need to rule out osteoarthritis as a potential cause of your knee pain.  -We will get an x-ray of your right knee to check for signs of osteoarthritis.

## 2024-01-05 NOTE — Progress Notes (Signed)
 Knee   Subjective:    Patient ID: Cheryl Mathews, female    DOB: 58 y.o., [birth year]   MRN: [id number]  HPI  Chief Complaint: Acute R knee pain  History of Present Illness Cheryl Mathews is a 58 year old female with arthritis who presents with knee pain.  Knee pain - Sharp, stabbing pain localized to the medial aspect of the knee - Pain is intermittent and exacerbated by stair use - Occasional numbness in the foot, particularly when sitting or sleeping in certain positions - No history of knee injuries or surgeries - Occasional sensation of a soft spot on the knee - Pain sometimes results in limping - Previously informed of bone spur and arthritis in the knee - No specific treatment initiated for knee pain  Functional status and physical activity - Remains physically active, engaging in walking and jogging with her son and dogs - History of playing sports and continues to stay active despite knee pain - Motivated to lose weight to assist with blood pressure and heart condition management - Actively working on weight loss through physical activity  Review of pertinent imaging: 09/04/2013 lumbar spine MRI Normal signal is present in the conus medullaris which terminates at L1.  Marrow signal, vertebral body heights, and alignment are all normal.  Limited imaging of the abdomen is unremarkable.  Disc signal and morphology is preserved throughout the lumbar spine.  There is no focal protrusion or stenosis. Impression: Negative MRI of the lumbar spine.     Objective:   Physical Exam Vitals:   01/05/24 1410  BP: (!) 160/80   Right Knee (compared to normal) -Inspection: no significant swelling, erythema, deformity or visible effusion. -Palpation: TTP - quad tendon, + patella, - patellar tendon, - tibial tuberosity, - pes bursa, - gerdy tubercle, + medial joint line, + lateral joint line, - posterior knee, - medial and lateral hamstrings. Moderate crepitus with  flexion/extension. -AROM/PROM: 0 degrees extension, 115 degrees flexion, low hamstring flexibility -Strength: medial movement of knee with single leg squat, 5/5 flexion, 5/5 extension. Weak gluteus medius on testing -Special tests:    -ACL: - lachman, - lever test   -MCL: 1-2+ and painless with valgus at 0/30 degrees   -LCL: stable and painless with varus at 0/30 degrees   -PCL: -sag sign   -Meniscus: - thessaly, - McMurray   -Patellofemoral: + patellar grind     Assessment & Plan:   Assessment & Plan Right knee patellofemoral pain syndrome   Intermittent sharp pain in the right knee, especially when descending stairs, aligns with patellofemoral pain syndrome. The pain is likely due to muscle imbalances in the quadriceps and hip, causing patellar misalignment. There is no significant swelling or effusion, indicating periarticular inflammation. The condition is reversible with proper treatment. Order a right knee x-ray to rule out arthritis and confirm the diagnosis. Prescribe naproxen  for inflammation and pain management. Refer to physical therapy to correct muscle imbalances in the quadriceps and hip. Reassess in 8 weeks to evaluate treatment effectiveness.  Rule out right knee osteoarthritis   Right knee osteoarthritis must be ruled out as a potential cause of knee pain. The absence of significant swelling and effusion makes severe arthritis less likely. Order a right knee x-ray to assess for osteoarthritis.

## 2024-02-21 ENCOUNTER — Other Ambulatory Visit: Payer: Self-pay

## 2024-02-21 DIAGNOSIS — M222X1 Patellofemoral disorders, right knee: Secondary | ICD-10-CM

## 2024-02-29 NOTE — Progress Notes (Unsigned)
   Subjective:    Patient ID: Cheryl Mathews, female    DOB: 59 y.o., 19-May-1965   MRN: [id number]  Chief Complaint: Right knee patellofemoral pain syndrome  Discussed the use of AI scribe software for clinical note transcription with the patient, who gave verbal consent to proceed.  History of Present Illness Cheryl Mathews is a 58 year old female with past medical history significant for migraine, fibromyalgia, sarcoidosis presenting for follow-up on right knee patellofemoral pain syndrome.  Previously diagnosed with this by myself on 01/05/2024 where x-rays showed mild spurring around the quadriceps tendon insertion, but were otherwise normal.  She was referred to physical therapy but consideration given for potential corticosteroid injection intra-articularly of her pain persists.  Today she reports ***.       Objective:   There were no vitals filed for this visit.  Right knee: ***     Assessment & Plan:   Assessment & Plan

## 2024-03-01 ENCOUNTER — Ambulatory Visit

## 2024-03-01 ENCOUNTER — Other Ambulatory Visit: Payer: Self-pay

## 2024-03-01 VITALS — BP 150/92 | Ht 63.0 in | Wt 237.0 lb

## 2024-03-01 DIAGNOSIS — M222X1 Patellofemoral disorders, right knee: Secondary | ICD-10-CM | POA: Diagnosis not present

## 2024-03-01 DIAGNOSIS — M7601 Gluteal tendinitis, right hip: Secondary | ICD-10-CM | POA: Diagnosis not present

## 2024-03-01 DIAGNOSIS — M25551 Pain in right hip: Secondary | ICD-10-CM | POA: Diagnosis not present

## 2024-03-01 MED ORDER — METHYLPREDNISOLONE ACETATE 40 MG/ML IJ SUSP
40.0000 mg | Freq: Once | INTRAMUSCULAR | Status: AC
  Administered 2024-03-01: 40 mg via INTRA_ARTICULAR
